# Patient Record
Sex: Male | Born: 1935 | Race: White | Hispanic: No | Marital: Single | Smoking: Former smoker
Health system: Southern US, Community
[De-identification: ages and names within clinical notes are randomized; demographics above are authoritative.]

## PROBLEM LIST (undated history)

## (undated) DIAGNOSIS — Z87438 Personal history of other diseases of male genital organs: Secondary | ICD-10-CM

## (undated) DIAGNOSIS — E785 Hyperlipidemia, unspecified: Secondary | ICD-10-CM

## (undated) DIAGNOSIS — I10 Essential (primary) hypertension: Secondary | ICD-10-CM

## (undated) DIAGNOSIS — R079 Chest pain, unspecified: Secondary | ICD-10-CM

## (undated) DIAGNOSIS — I2510 Atherosclerotic heart disease of native coronary artery without angina pectoris: Secondary | ICD-10-CM

## (undated) DIAGNOSIS — I441 Atrioventricular block, second degree: Secondary | ICD-10-CM

## (undated) DIAGNOSIS — E119 Type 2 diabetes mellitus without complications: Secondary | ICD-10-CM

## (undated) HISTORY — DX: Type 2 diabetes mellitus without complications: E11.9

## (undated) HISTORY — PX: CARDIAC CATHETERIZATION: SHX172

## (undated) HISTORY — DX: Essential (primary) hypertension: I10

## (undated) HISTORY — DX: Atherosclerotic heart disease of native coronary artery without angina pectoris: I25.10

## (undated) HISTORY — DX: Personal history of other diseases of male genital organs: Z87.438

## (undated) HISTORY — DX: Chest pain, unspecified: R07.9

## (undated) HISTORY — DX: Hyperlipidemia, unspecified: E78.5

---

## 2001-03-06 ENCOUNTER — Encounter: Payer: Self-pay | Admitting: Internal Medicine

## 2001-03-06 ENCOUNTER — Observation Stay (HOSPITAL_COMMUNITY): Admission: EM | Admit: 2001-03-06 | Discharge: 2001-03-07 | Payer: Self-pay | Admitting: Emergency Medicine

## 2001-03-07 ENCOUNTER — Encounter: Payer: Self-pay | Admitting: Internal Medicine

## 2009-03-17 ENCOUNTER — Ambulatory Visit: Payer: Self-pay | Admitting: Internal Medicine

## 2009-03-17 ENCOUNTER — Inpatient Hospital Stay (HOSPITAL_COMMUNITY): Admission: EM | Admit: 2009-03-17 | Discharge: 2009-03-28 | Payer: Self-pay | Admitting: Emergency Medicine

## 2009-03-18 ENCOUNTER — Ambulatory Visit: Payer: Self-pay | Admitting: Surgery

## 2009-03-19 ENCOUNTER — Ambulatory Visit: Payer: Self-pay | Admitting: Surgery

## 2009-03-19 ENCOUNTER — Encounter: Payer: Self-pay | Admitting: Surgery

## 2009-03-19 DIAGNOSIS — I2510 Atherosclerotic heart disease of native coronary artery without angina pectoris: Secondary | ICD-10-CM

## 2009-03-19 HISTORY — DX: Atherosclerotic heart disease of native coronary artery without angina pectoris: I25.10

## 2009-04-15 ENCOUNTER — Ambulatory Visit: Payer: Self-pay | Admitting: Surgery

## 2009-04-15 ENCOUNTER — Encounter: Admission: RE | Admit: 2009-04-15 | Discharge: 2009-04-15 | Payer: Self-pay | Admitting: Surgery

## 2009-04-29 ENCOUNTER — Encounter: Admission: RE | Admit: 2009-04-29 | Discharge: 2009-04-29 | Payer: Self-pay | Admitting: Surgery

## 2009-04-29 ENCOUNTER — Ambulatory Visit: Payer: Self-pay | Admitting: Surgery

## 2009-05-21 ENCOUNTER — Encounter: Admission: RE | Admit: 2009-05-21 | Discharge: 2009-05-21 | Payer: Self-pay | Admitting: Surgery

## 2009-05-21 ENCOUNTER — Ambulatory Visit: Payer: Self-pay | Admitting: Surgery

## 2010-04-10 LAB — DIFFERENTIAL
Basophils Relative: 1 % (ref 0–1)
Lymphocytes Relative: 26 % (ref 12–46)
Lymphs Abs: 1.4 10*3/uL (ref 0.7–4.0)
Monocytes Absolute: 0.6 10*3/uL (ref 0.1–1.0)
Monocytes Relative: 11 % (ref 3–12)
Neutro Abs: 3 10*3/uL (ref 1.7–7.7)

## 2010-04-10 LAB — CBC
HCT: 32.7 % — ABNORMAL LOW (ref 39.0–52.0)
Hemoglobin: 11.7 g/dL — ABNORMAL LOW (ref 13.0–17.0)
Hemoglobin: 13.3 g/dL (ref 13.0–17.0)
MCHC: 35.3 g/dL (ref 30.0–36.0)
Platelets: 150 10*3/uL (ref 150–400)
Platelets: 151 10*3/uL (ref 150–400)
RDW: 13.3 % (ref 11.5–15.5)
RDW: 13.4 % (ref 11.5–15.5)
WBC: 7 10*3/uL (ref 4.0–10.5)

## 2010-04-10 LAB — GLUCOSE, CAPILLARY
Glucose-Capillary: 117 mg/dL — ABNORMAL HIGH (ref 70–99)
Glucose-Capillary: 194 mg/dL — ABNORMAL HIGH (ref 70–99)
Glucose-Capillary: 232 mg/dL — ABNORMAL HIGH (ref 70–99)
Glucose-Capillary: 267 mg/dL — ABNORMAL HIGH (ref 70–99)
Glucose-Capillary: 334 mg/dL — ABNORMAL HIGH (ref 70–99)

## 2010-04-10 LAB — HEMOGLOBIN A1C
Hgb A1c MFr Bld: 8.2 % — ABNORMAL HIGH (ref 4.6–6.1)
Mean Plasma Glucose: 189 mg/dL

## 2010-04-10 LAB — COMPREHENSIVE METABOLIC PANEL
AST: 19 U/L (ref 0–37)
Albumin: 3.2 g/dL — ABNORMAL LOW (ref 3.5–5.2)
Alkaline Phosphatase: 34 U/L — ABNORMAL LOW (ref 39–117)
BUN: 31 mg/dL — ABNORMAL HIGH (ref 6–23)
Calcium: 8.5 mg/dL (ref 8.4–10.5)
Chloride: 102 mEq/L (ref 96–112)
GFR calc non Af Amer: 40 mL/min — ABNORMAL LOW (ref >60)
Potassium: 4 mEq/L (ref 3.5–5.1)
Sodium: 138 mEq/L (ref 135–145)
Total Bilirubin: 0.9 mg/dL (ref 0.3–1.2)

## 2010-04-10 LAB — LIPID PANEL
HDL: 35 mg/dL — ABNORMAL LOW (ref >39)
Triglycerides: 181 mg/dL — ABNORMAL HIGH (ref <150)

## 2010-04-10 LAB — POCT I-STAT, CHEM 8
BUN: 34 mg/dL — ABNORMAL HIGH (ref 6–23)
Calcium, Ion: 1.1 mmol/L — ABNORMAL LOW (ref 1.12–1.32)
Creatinine, Ser: 1.8 mg/dL — ABNORMAL HIGH (ref 0.4–1.5)
HCT: 38 % — ABNORMAL LOW (ref 39.0–52.0)
Hemoglobin: 12.9 g/dL — ABNORMAL LOW (ref 13.0–17.0)
TCO2: 26 mmol/L (ref 0–100)

## 2010-04-10 LAB — CARDIAC PANEL(CRET KIN+CKTOT+MB+TROPI)
CK, MB: 3.5 ng/mL (ref 0.3–4.0)
Total CK: 142 U/L (ref 7–232)

## 2010-04-10 LAB — POCT CARDIAC MARKERS
CKMB, poc: 3.8 ng/mL (ref 1.0–8.0)
Myoglobin, poc: 162 ng/mL (ref 12–200)

## 2010-04-10 LAB — TROPONIN I: Troponin I: 0.04 ng/mL (ref 0.00–0.06)

## 2010-04-10 LAB — PROTIME-INR: Prothrombin Time: 13.9 seconds (ref 11.6–15.2)

## 2010-04-10 LAB — CK TOTAL AND CKMB (NOT AT ARMC): Relative Index: 2.9 — ABNORMAL HIGH (ref 0.0–2.5)

## 2010-04-14 LAB — GLUCOSE, CAPILLARY
Glucose-Capillary: 100 mg/dL — ABNORMAL HIGH (ref 70–99)
Glucose-Capillary: 111 mg/dL — ABNORMAL HIGH (ref 70–99)
Glucose-Capillary: 113 mg/dL — ABNORMAL HIGH (ref 70–99)
Glucose-Capillary: 125 mg/dL — ABNORMAL HIGH (ref 70–99)
Glucose-Capillary: 137 mg/dL — ABNORMAL HIGH (ref 70–99)
Glucose-Capillary: 137 mg/dL — ABNORMAL HIGH (ref 70–99)
Glucose-Capillary: 140 mg/dL — ABNORMAL HIGH (ref 70–99)
Glucose-Capillary: 151 mg/dL — ABNORMAL HIGH (ref 70–99)
Glucose-Capillary: 155 mg/dL — ABNORMAL HIGH (ref 70–99)
Glucose-Capillary: 159 mg/dL — ABNORMAL HIGH (ref 70–99)
Glucose-Capillary: 163 mg/dL — ABNORMAL HIGH (ref 70–99)
Glucose-Capillary: 163 mg/dL — ABNORMAL HIGH (ref 70–99)
Glucose-Capillary: 164 mg/dL — ABNORMAL HIGH (ref 70–99)
Glucose-Capillary: 164 mg/dL — ABNORMAL HIGH (ref 70–99)
Glucose-Capillary: 180 mg/dL — ABNORMAL HIGH (ref 70–99)
Glucose-Capillary: 193 mg/dL — ABNORMAL HIGH (ref 70–99)
Glucose-Capillary: 203 mg/dL — ABNORMAL HIGH (ref 70–99)
Glucose-Capillary: 234 mg/dL — ABNORMAL HIGH (ref 70–99)
Glucose-Capillary: 238 mg/dL — ABNORMAL HIGH (ref 70–99)
Glucose-Capillary: 250 mg/dL — ABNORMAL HIGH (ref 70–99)
Glucose-Capillary: 284 mg/dL — ABNORMAL HIGH (ref 70–99)
Glucose-Capillary: 85 mg/dL (ref 70–99)
Glucose-Capillary: 95 mg/dL (ref 70–99)

## 2010-04-14 LAB — BASIC METABOLIC PANEL
BUN: 19 mg/dL (ref 6–23)
BUN: 19 mg/dL (ref 6–23)
BUN: 20 mg/dL (ref 6–23)
BUN: 36 mg/dL — ABNORMAL HIGH (ref 6–23)
BUN: 37 mg/dL — ABNORMAL HIGH (ref 6–23)
BUN: 49 mg/dL — ABNORMAL HIGH (ref 6–23)
BUN: 51 mg/dL — ABNORMAL HIGH (ref 6–23)
BUN: 55 mg/dL — ABNORMAL HIGH (ref 6–23)
CO2: 22 mEq/L (ref 19–32)
CO2: 24 mEq/L (ref 19–32)
CO2: 25 mEq/L (ref 19–32)
CO2: 25 mEq/L (ref 19–32)
Calcium: 7.4 mg/dL — ABNORMAL LOW (ref 8.4–10.5)
Calcium: 7.7 mg/dL — ABNORMAL LOW (ref 8.4–10.5)
Calcium: 7.8 mg/dL — ABNORMAL LOW (ref 8.4–10.5)
Calcium: 7.9 mg/dL — ABNORMAL LOW (ref 8.4–10.5)
Calcium: 8.1 mg/dL — ABNORMAL LOW (ref 8.4–10.5)
Calcium: 8.1 mg/dL — ABNORMAL LOW (ref 8.4–10.5)
Calcium: 8.5 mg/dL (ref 8.4–10.5)
Chloride: 101 mEq/L (ref 96–112)
Chloride: 103 mEq/L (ref 96–112)
Chloride: 105 mEq/L (ref 96–112)
Creatinine, Ser: 1.44 mg/dL (ref 0.4–1.5)
Creatinine, Ser: 1.74 mg/dL — ABNORMAL HIGH (ref 0.4–1.5)
Creatinine, Ser: 1.78 mg/dL — ABNORMAL HIGH (ref 0.4–1.5)
Creatinine, Ser: 1.87 mg/dL — ABNORMAL HIGH (ref 0.4–1.5)
Creatinine, Ser: 1.93 mg/dL — ABNORMAL HIGH (ref 0.4–1.5)
Creatinine, Ser: 1.94 mg/dL — ABNORMAL HIGH (ref 0.4–1.5)
Creatinine, Ser: 1.96 mg/dL — ABNORMAL HIGH (ref 0.4–1.5)
GFR calc Af Amer: 40 mL/min — ABNORMAL LOW (ref >60)
GFR calc Af Amer: 41 mL/min — ABNORMAL LOW (ref >60)
GFR calc Af Amer: 41 mL/min — ABNORMAL LOW (ref >60)
GFR calc Af Amer: 46 mL/min — ABNORMAL LOW (ref >60)
GFR calc Af Amer: 57 mL/min — ABNORMAL LOW (ref >60)
GFR calc non Af Amer: 34 mL/min — ABNORMAL LOW (ref >60)
GFR calc non Af Amer: 34 mL/min — ABNORMAL LOW (ref >60)
GFR calc non Af Amer: 35 mL/min — ABNORMAL LOW (ref >60)
GFR calc non Af Amer: 35 mL/min — ABNORMAL LOW (ref >60)
GFR calc non Af Amer: 37 mL/min — ABNORMAL LOW (ref >60)
GFR calc non Af Amer: 38 mL/min — ABNORMAL LOW (ref >60)
GFR calc non Af Amer: 48 mL/min — ABNORMAL LOW (ref >60)
GFR calc non Af Amer: 52 mL/min — ABNORMAL LOW (ref >60)
Glucose, Bld: 105 mg/dL — ABNORMAL HIGH (ref 70–99)
Glucose, Bld: 110 mg/dL — ABNORMAL HIGH (ref 70–99)
Glucose, Bld: 119 mg/dL — ABNORMAL HIGH (ref 70–99)
Glucose, Bld: 120 mg/dL — ABNORMAL HIGH (ref 70–99)
Glucose, Bld: 176 mg/dL — ABNORMAL HIGH (ref 70–99)
Glucose, Bld: 56 mg/dL — ABNORMAL LOW (ref 70–99)
Glucose, Bld: 79 mg/dL (ref 70–99)
Glucose, Bld: 86 mg/dL (ref 70–99)
Potassium: 3.4 mEq/L — ABNORMAL LOW (ref 3.5–5.1)
Potassium: 4.1 mEq/L (ref 3.5–5.1)
Potassium: 4.1 mEq/L (ref 3.5–5.1)
Potassium: 4.1 mEq/L (ref 3.5–5.1)
Potassium: 4.2 mEq/L (ref 3.5–5.1)
Potassium: 4.4 mEq/L (ref 3.5–5.1)
Potassium: 4.5 mEq/L (ref 3.5–5.1)
Sodium: 131 mEq/L — ABNORMAL LOW (ref 135–145)
Sodium: 134 mEq/L — ABNORMAL LOW (ref 135–145)
Sodium: 135 mEq/L (ref 135–145)
Sodium: 136 mEq/L (ref 135–145)

## 2010-04-14 LAB — PROTIME-INR
INR: 1.15 (ref 0.00–1.49)
Prothrombin Time: 14.6 seconds (ref 11.6–15.2)
Prothrombin Time: 19.6 seconds — ABNORMAL HIGH (ref 11.6–15.2)

## 2010-04-14 LAB — BLOOD GAS, ARTERIAL
Bicarbonate: 23 mEq/L (ref 20.0–24.0)
FIO2: 0.21 %
O2 Saturation: 97.5 %
pCO2 arterial: 38.1 mmHg (ref 35.0–45.0)
pH, Arterial: 7.398 (ref 7.350–7.450)
pO2, Arterial: 85.1 mmHg (ref 80.0–100.0)

## 2010-04-14 LAB — URINALYSIS, ROUTINE W REFLEX MICROSCOPIC
Bilirubin Urine: NEGATIVE
Glucose, UA: 100 mg/dL — AB
Ketones, ur: NEGATIVE mg/dL
Nitrite: NEGATIVE
Specific Gravity, Urine: 1.011 (ref 1.005–1.030)
pH: 7 (ref 5.0–8.0)

## 2010-04-14 LAB — CBC
HCT: 25 % — ABNORMAL LOW (ref 39.0–52.0)
HCT: 26.1 % — ABNORMAL LOW (ref 39.0–52.0)
HCT: 34.8 % — ABNORMAL LOW (ref 39.0–52.0)
HCT: 37 % — ABNORMAL LOW (ref 39.0–52.0)
Hemoglobin: 8.8 g/dL — ABNORMAL LOW (ref 13.0–17.0)
Hemoglobin: 9.7 g/dL — ABNORMAL LOW (ref 13.0–17.0)
MCHC: 34.8 g/dL (ref 30.0–36.0)
MCHC: 35 g/dL (ref 30.0–36.0)
MCHC: 35.1 g/dL (ref 30.0–36.0)
MCHC: 35.2 g/dL (ref 30.0–36.0)
MCV: 94.9 fL (ref 78.0–100.0)
MCV: 97.3 fL (ref 78.0–100.0)
MCV: 97.4 fL (ref 78.0–100.0)
MCV: 98.7 fL (ref 78.0–100.0)
Platelets: 106 10*3/uL — ABNORMAL LOW (ref 150–400)
Platelets: 107 10*3/uL — ABNORMAL LOW (ref 150–400)
Platelets: 118 10*3/uL — ABNORMAL LOW (ref 150–400)
Platelets: 136 10*3/uL — ABNORMAL LOW (ref 150–400)
Platelets: 159 10*3/uL (ref 150–400)
Platelets: 181 10*3/uL (ref 150–400)
Platelets: 232 10*3/uL (ref 150–400)
RBC: 2.51 MIL/uL — ABNORMAL LOW (ref 4.22–5.81)
RBC: 2.53 MIL/uL — ABNORMAL LOW (ref 4.22–5.81)
RBC: 2.64 MIL/uL — ABNORMAL LOW (ref 4.22–5.81)
RDW: 13.7 % (ref 11.5–15.5)
RDW: 13.8 % (ref 11.5–15.5)
RDW: 13.8 % (ref 11.5–15.5)
RDW: 14.1 % (ref 11.5–15.5)
RDW: 14.4 % (ref 11.5–15.5)
RDW: 14.5 % (ref 11.5–15.5)
WBC: 5.3 10*3/uL (ref 4.0–10.5)
WBC: 6.3 10*3/uL (ref 4.0–10.5)
WBC: 6.3 10*3/uL (ref 4.0–10.5)
WBC: 6.4 10*3/uL (ref 4.0–10.5)
WBC: 6.8 10*3/uL (ref 4.0–10.5)
WBC: 7.1 10*3/uL (ref 4.0–10.5)

## 2010-04-14 LAB — PREPARE PLATELETS

## 2010-04-14 LAB — POCT I-STAT 3, ART BLOOD GAS (G3+)
Acid-base deficit: 3 mmol/L — ABNORMAL HIGH (ref 0.0–2.0)
O2 Saturation: 99 %
Patient temperature: 37.5
pCO2 arterial: 28.4 mmHg — ABNORMAL LOW (ref 35.0–45.0)
pCO2 arterial: 35.5 mmHg (ref 35.0–45.0)
pH, Arterial: 7.443 (ref 7.350–7.450)
pO2, Arterial: 130 mmHg — ABNORMAL HIGH (ref 80.0–100.0)
pO2, Arterial: 146 mmHg — ABNORMAL HIGH (ref 80.0–100.0)

## 2010-04-14 LAB — POCT I-STAT 4, (NA,K, GLUC, HGB,HCT)
Glucose, Bld: 156 mg/dL — ABNORMAL HIGH (ref 70–99)
Glucose, Bld: 202 mg/dL — ABNORMAL HIGH (ref 70–99)
HCT: 21 % — ABNORMAL LOW (ref 39.0–52.0)
HCT: 21 % — ABNORMAL LOW (ref 39.0–52.0)
HCT: 26 % — ABNORMAL LOW (ref 39.0–52.0)
HCT: 27 % — ABNORMAL LOW (ref 39.0–52.0)
Hemoglobin: 10.2 g/dL — ABNORMAL LOW (ref 13.0–17.0)
Hemoglobin: 7.1 g/dL — ABNORMAL LOW (ref 13.0–17.0)
Hemoglobin: 9.2 g/dL — ABNORMAL LOW (ref 13.0–17.0)
Potassium: 4.3 mEq/L (ref 3.5–5.1)
Potassium: 5.3 mEq/L — ABNORMAL HIGH (ref 3.5–5.1)
Sodium: 134 mEq/L — ABNORMAL LOW (ref 135–145)

## 2010-04-14 LAB — TYPE AND SCREEN
ABO/RH(D): A POS
Antibody Screen: NEGATIVE

## 2010-04-14 LAB — POCT I-STAT, CHEM 8
Hemoglobin: 8.2 g/dL — ABNORMAL LOW (ref 13.0–17.0)
Sodium: 134 mEq/L — ABNORMAL LOW (ref 135–145)
TCO2: 21 mmol/L (ref 0–100)

## 2010-04-14 LAB — CREATININE, SERUM
Creatinine, Ser: 1.25 mg/dL (ref 0.4–1.5)
Creatinine, Ser: 1.74 mg/dL — ABNORMAL HIGH (ref 0.4–1.5)
GFR calc Af Amer: >60 mL/min (ref >60)
GFR calc non Af Amer: 56 mL/min — ABNORMAL LOW (ref >60)

## 2010-04-14 LAB — PLATELET COUNT: Platelets: 109 10*3/uL — ABNORMAL LOW (ref 150–400)

## 2010-04-14 LAB — MRSA PCR SCREENING: MRSA by PCR: NEGATIVE

## 2010-04-14 LAB — MAGNESIUM
Magnesium: 2.5 mg/dL (ref 1.5–2.5)
Magnesium: 2.8 mg/dL — ABNORMAL HIGH (ref 1.5–2.5)

## 2010-04-14 LAB — ABO/RH: ABO/RH(D): A POS

## 2010-04-14 LAB — HEMOGLOBIN AND HEMATOCRIT, BLOOD: Hemoglobin: 7.1 g/dL — ABNORMAL LOW (ref 13.0–17.0)

## 2010-06-03 NOTE — Assessment & Plan Note (Signed)
OFFICE VISIT   Vernon Jackson, Vernon Jackson  DOB:  December 14, 1927                                        April 29, 2009  CHART #:  [id number]   HISTORY:  The patient comes in today for a 2-week follow up.  He was  last seen on April 15, 2009, with complaints of worsening shortness of  breath and increasing lower extremity edema with weight gain.  On exam,  he had significant lower extremity edema with small left effusion on  chest x-ray.  He was continued on an additional 2-week course of Lasix.  He states that since his last visit his weight and his lower extremity  edema have both improved significantly.  His breathing has improved as  well, although he still gets short of breath with moderate exertion.  He  saw Dr. Eldridge Dace shortly after being seen here in our office and his  amiodarone was discontinued since he has been maintaining sinus rhythm.  No changes to his antihypertensive medications were made.  However, they  did recommend that he double up on his Lasix for the next 3 days, taking  80 mg daily, then resuming a 40 mg daily dose which indicated he would  need to be on indefinitely.  He does have a followup appointment to see  Dr. Eldridge Dace in 2 weeks.  Overall, he feels that he is making some slow  progress.   PHYSICAL EXAMINATION:  Vital Signs:  Blood pressure is 140/68, pulse is  66, respirations 18, O2 sat 96% on room air.  Chest:  Sternal and leg  incisions have healed well.  Sternum is stable to palpation.  Heart:  Regular rate and rhythm without murmurs, rubs, or gallops.  Lungs:  Clear, although breath sounds are slightly diminished in the left base.  Lower Extremities:  Lower extremities remain edematous at around 1+  pitting edema bilaterally, right greater than left.   DIAGNOSTIC TESTS:  Chest x-ray shows a stable to maybe slightly improved  left effusion.   ASSESSMENT AND PLAN:  The patient is making some improvements since his  last visit.  We  will continue him on Lasix as prescribed by Dr.  Eldridge Dace.  I have asked him to return in 2-3  weeks for a follow up with Dr. Laneta Simmers, and we will obtain a repeat chest  x-ray at that time to reevaluate this effusion.   Evelene Croon, M.D.  Electronically Signed   GC/MEDQ  D:  04/29/2009  T:  04/30/2009  Job:  [id number]   cc:   Corky Crafts, MD

## 2010-06-03 NOTE — Assessment & Plan Note (Signed)
OFFICE VISIT   Vernon Jackson, Vernon Jackson  DOB:  September 03, 1927                                        April 15, 2009  CHART #:  [id number]   HISTORY:  The patient comes in today for a postoperative 3-week  followup.  He is status post coronary artery bypass grafting x4 on March 20, 2009.  His postoperative course was complicated by atrial  fibrillation and subsequent bradycardia.  He was treated with amiodarone  and his beta-blocker was discontinued and at the time of discharge, his  heart rate was stable in sinus rhythm.  Also, he had an acute renal  insufficiency postoperatively, which had resolved at the time of  discharge.  Today, he reports worsening symptoms over about the past  week of lower extremity edema and shortness of breath with exertion.  He  had been discharged home on 7 days' worth of Lasix and has completed the  course of this medication.  He notes that his weight has increased 2-3  pounds over this time as well.  He has an appointment to see Dr.  Eldridge Dace this week for Cardiology followup.  He is walking as much as he  is able secondary to previous back and hip problems.  He denies any  chest pain and has not required any pain medication since he was  discharged.  He does have significant insomnia and is not sleeping well  at all.   PHYSICAL EXAMINATION:  Vital Signs:  Blood pressure is 154/69, pulse is  69, respirations 18, and O2 sat 96% on room air.  Chest:  His sternal  and right lower extremity EVH incisions have all healed well.  The  sternum is stable to palpation.  Heart:  Regular rate and rhythm without  murmurs, rubs, or gallops.  Lungs:  Clear, though breath sounds are  slightly diminished in the left base.   Chest x-ray today shows a small left effusion, which is slightly  increased since his discharge chest x-ray as well as some basilar  atelectasis.   ASSESSMENT/PLAN:  Overall, the patient is doing well status post  coronary artery  bypass graft.  In light of his recent weight gain and  worsening lower extremity edema and shortness of breath, I have  prescribed an additional 2-week course of Lasix 40 mg daily and  potassium 20 mEq daily.  We will plan to see him back in 2 weeks for  follow up with a chest x-ray to reevaluate his volume status.  His blood  pressures have started to trend upward, but since he has an appointment  with Dr. Eldridge Dace this week, I have not adjusted his antihypertensive  medications.  He had been started on Norvasc in the hospital as opposed  to an ACE inhibitor because of his acute renal insufficiency.  Also, I  have given him a prescription for 2-week course of Ambien 5 mg nightly  p.r.n. to help with sleep.  He may call in the interim if he experiences  any problems or has questions.   Evelene Croon, M.D.  Electronically Signed   GC/MEDQ  D:  04/15/2009  T:  04/16/2009  Job:  [id number]   cc:   Corky Crafts, MD

## 2010-06-03 NOTE — Assessment & Plan Note (Signed)
OFFICE VISIT   Vernon Jackson, Vernon Jackson  DOB:  01/22/1927                                        May 21, 2009  CHART #:  [id number]   HISTORY:  The patient returns today for followup status post coronary  artery bypass graft surgery x4 on March 20, 2009.  He was seen by one of  my PAs on April 29, 2009.  Since that time, he said he has been making  continued improvement, but still does not feel as good as he would like  to.  He does not walk very much due to pain in his hips.  He has been  riding exercise bicycle twice a day.  He denies any shortness of breath  or chest pain.   PHYSICAL EXAMINATION:  Vital Signs:  Blood pressure is 172/81, pulse 75  and regular, and respiratory rate is 18, unlabored.  Oxygen saturation  on room air is 98%.  General:  He looks well.  Cardiac:  Regular rate  and rhythm with normal heart sounds.  Lungs:  Clear.  His chest incision  is healing well and the sternum is stable.  Extremities:  His leg  incision is healing well.  There is mild bilateral lower extremity edema  to the mid calf level.   DIAGNOSTIC TESTS:  Followup chest x-ray shows minimal residual left  pleural effusion.  Lungs are otherwise clear.   MEDICATIONS:  1. Amlodipine 5 mg daily.  2. Aspirin 325 mg daily.  3. Crestor 10 mg nightly.  4. Iron 150 mg daily.  5. NovoLog 70/30, 20 units b.i.d.  6. Ambien 5 mg p.r.n.   He had been on Lasix 40 mg per day and potassium 20 mEq per day, but  said that Dr. Eldridge Dace told him to stop that recently.   IMPRESSION:  Overall, the patient is making a steady progress following  surgery.  He feels that he has developed some edema in his legs and it  is obvious on exam.  I told him to resume his Lasix and potassium until  he goes back to see Dr. Eldridge Dace.  His blood pressure is also up today,  but it is not sure whether he took his blood pressure medication this  morning.  This also need to be reevaluated when he goes back to  see Dr.  Eldridge Dace.  He had previously been on amlodipine 10 mg per day, but this  was decreased by Dr. Eldridge Dace recently.  I encouraged him to continue  exercising.  He is going to schedule an appointment with an orthopedic  surgeon to have his hips evaluated.  He has a followup appointment with  Dr. Eldridge Dace in a couple of weeks and will return to see me if he  develops any problems with his incisions.   Evelene Croon, M.D.  Electronically Signed   BB/MEDQ  D:  05/21/2009  T:  05/22/2009  Job:  [id number]   cc:   Corky Crafts, MD

## 2012-10-09 ENCOUNTER — Encounter: Payer: Self-pay | Admitting: *Deleted

## 2012-10-09 DIAGNOSIS — I2510 Atherosclerotic heart disease of native coronary artery without angina pectoris: Secondary | ICD-10-CM | POA: Insufficient documentation

## 2012-10-09 DIAGNOSIS — E119 Type 2 diabetes mellitus without complications: Secondary | ICD-10-CM | POA: Insufficient documentation

## 2012-10-09 DIAGNOSIS — Z87438 Personal history of other diseases of male genital organs: Secondary | ICD-10-CM | POA: Insufficient documentation

## 2012-10-09 DIAGNOSIS — E785 Hyperlipidemia, unspecified: Secondary | ICD-10-CM | POA: Insufficient documentation

## 2012-10-09 DIAGNOSIS — I10 Essential (primary) hypertension: Secondary | ICD-10-CM | POA: Insufficient documentation

## 2012-10-20 ENCOUNTER — Ambulatory Visit (INDEPENDENT_AMBULATORY_CARE_PROVIDER_SITE_OTHER): Payer: Medicare HMO | Admitting: Interventional Cardiology

## 2012-10-20 ENCOUNTER — Encounter: Payer: Self-pay | Admitting: Interventional Cardiology

## 2012-10-20 VITALS — BP 144/71 | HR 47 | Ht 66.0 in | Wt 141.6 lb

## 2012-10-20 DIAGNOSIS — R1900 Intra-abdominal and pelvic swelling, mass and lump, unspecified site: Secondary | ICD-10-CM | POA: Insufficient documentation

## 2012-10-20 DIAGNOSIS — I10 Essential (primary) hypertension: Secondary | ICD-10-CM

## 2012-10-20 DIAGNOSIS — E785 Hyperlipidemia, unspecified: Secondary | ICD-10-CM

## 2012-10-20 DIAGNOSIS — I2510 Atherosclerotic heart disease of native coronary artery without angina pectoris: Secondary | ICD-10-CM

## 2012-10-20 MED ORDER — AMLODIPINE BESYLATE 10 MG PO TABS: 10.0000 mg | ORAL_TABLET | Freq: Every day | ORAL | Status: DC

## 2012-10-20 MED ORDER — CLONIDINE HCL 0.1 MG PO TABS: 0.1000 mg | ORAL_TABLET | Freq: Two times a day (BID) | ORAL | Status: DC

## 2012-10-20 NOTE — Patient Instructions (Addendum)
Your physician wants you to follow-up in: 1 year with Dr. Eldridge Dace. You will receive a reminder letter in the mail two months in advance. If you don't receive a letter, please call our office to schedule the follow-up appointment.  Increase Amlodipine to 10mg  daily.  Call in 2 weeks with BP readings. [phone]

## 2012-10-20 NOTE — Progress Notes (Signed)
Patient ID: Vernon Jackson, male   DOB: 11/13/1927, 79 y.o.   MRN: [id number]    [address] [place], [place]  [address] Phone: [phone] Fax:  [phone]  Date:  10/20/2012   ID:  Vernon Jackson, DOB 1927/07/08, MRN [id number]  PCP:  No primary provider on file.      History of Present Illness: Vernon Jackson is a 79 y.o. male who has had CABG. His walking is limited by hip pain. He has not been exercising recently. CAD/ASCVD:  Denies : Chest pain.  Diaphoresis.  Dizziness.  Dyspnea on exertion.  Leg edema.  Nitroglycerin.  Orthopnea.  Palpitations.  Paroxysmal nocturnal dyspnea.  Syncope.     Wt Readings from Last 3 Encounters:  10/20/12 141 lb 9.6 oz (64.229 kg)     Past Medical History  Diagnosis Date  . Coronary artery disease     Bypass graft surgeryx4 03/2009  . CAD (coronary artery disease) 03 01 2011    EF 60%, mild MR   . Hypertension   . Hyperlipidemia   . Diabetes mellitus without complication   . History of BPH   . Chest pain     /1/11 echocardiogram EF 60%, mild MR,    Current Outpatient Prescriptions  Medication Sig Dispense Refill  . amLODipine (NORVASC) 5 MG tablet Take 5 mg by mouth daily.      Marland Kitchen aspirin 325 MG tablet Take 81 mg by mouth daily.       . CloNIDine HCl (CATAPRES PO) Take by mouth as directed.      . insulin aspart protamine- aspart (NOVOLOG MIX 70/30) (70-30) 100 UNIT/ML injection Inject into the skin.      . metoprolol (LOPRESSOR) 50 MG tablet Take 50 mg by mouth as directed.      . simvastatin (ZOCOR) 20 MG tablet Take 20 mg by mouth every evening.      Marland Kitchen spironolactone (ALDACTONE) 50 MG tablet Take 50 mg by mouth as directed.       No current facility-administered medications for this visit.    Allergies:   No Known Allergies  Social History:  The patient  reports that he has never smoked. He does not have any smokeless tobacco history on file.   Family History:  The patient's family history is  not on file.   ROS:  Please see the history of present illness.  No nausea, vomiting.  No fevers, chills.  No focal weakness.  No dysuria.    All other systems reviewed and negative.   PHYSICAL EXAM: VS:  BP 144/71  Pulse 47  Ht 5\' 6"  (1.676 m)  Wt 141 lb 9.6 oz (64.229 kg)  BMI 22.87 kg/m2 Well nourished, well developed, in no acute distress HEENT: normal Neck: no JVD, no carotid bruits Cardiac:  normal S1, S2; RRR;  Lungs:  clear to auscultation bilaterally, no wheezing, rhonchi or rales Abd: soft, nontender, no hepatomegaly Ext: no edema Skin: warm and dry Neuro:   no focal abnormalities noted  EKG:  NSR, PACs, anterolateral T wave inversion - more pronounced since May 31, 2011.  ASSESSMENT AND PLAN:  Coronary atherosclerosis of native coronary artery  Continue Aspirin Tablet, 325 MG, 1 tablet, Orally, Once a day   No angina. COntinue to try to exercise.  2. Hypertension, essential  Increase Amlodipine Besylate Tablet, 10 MG, 1 tablet, Orally, Once a day, 90, Refills 3 Off ACE-I due to renal dysfunction.  Now on  CLonidine. Wean off Clonidine 0.1 BID if increased amlodipine decreases BP.  He will call us to give BP readings.   .  3. Mixed hyperlipidemia  Lipids controlled in 2014; LDL67. Continue simvastatin.  4.  Pulsatile abdominal mass.  I recommend abdominal ultrasound to evaluate for AAA.  He does not want to do the ultrasound.    Signed, Fredric Mare, MD, [facility] 10/20/2012 2:42 PM

## 2012-11-10 ENCOUNTER — Other Ambulatory Visit: Payer: Self-pay | Admitting: Cardiology

## 2012-11-10 MED ORDER — AMLODIPINE BESYLATE 10 MG PO TABS: 10.0000 mg | ORAL_TABLET | Freq: Every day | ORAL | Status: DC

## 2012-12-23 ENCOUNTER — Ambulatory Visit (INDEPENDENT_AMBULATORY_CARE_PROVIDER_SITE_OTHER): Payer: Medicare HMO | Admitting: Podiatrist

## 2012-12-23 ENCOUNTER — Encounter: Payer: Self-pay | Admitting: Podiatrist

## 2012-12-23 DIAGNOSIS — B351 Tinea unguium: Secondary | ICD-10-CM

## 2012-12-23 DIAGNOSIS — E1059 Type 1 diabetes mellitus with other circulatory complications: Secondary | ICD-10-CM

## 2012-12-23 DIAGNOSIS — M79609 Pain in unspecified limb: Secondary | ICD-10-CM

## 2012-12-23 DIAGNOSIS — M216X9 Other acquired deformities of unspecified foot: Secondary | ICD-10-CM

## 2012-12-23 DIAGNOSIS — Q828 Other specified congenital malformations of skin: Secondary | ICD-10-CM

## 2012-12-23 NOTE — Patient Instructions (Signed)
Diabetes and Foot Care Diabetes may cause you to have problems because of poor blood supply (circulation) to your feet and legs. This may cause the skin on your feet to become thinner, break easier, and heal more slowly. Your skin may become dry, and the skin may peel and crack. You may also have nerve damage in your legs and feet causing decreased feeling in them. You may not notice minor injuries to your feet that could lead to infections or more serious problems. Taking care of your feet is one of the most important things you can do for yourself.  HOME CARE INSTRUCTIONS  Wear shoes at all times, even in the house. Do not go barefoot. Bare feet are easily injured.  Check your feet daily for blisters, cuts, and redness. If you cannot see the bottom of your feet, use a mirror or ask someone for help.  Wash your feet with warm water (do not use hot water) and mild soap. Then pat your feet and the areas between your toes until they are completely dry. Do not soak your feet as this can dry your skin.  Apply a moisturizing lotion or petroleum jelly (that does not contain alcohol and is unscented) to the skin on your feet and to dry, brittle toenails. Do not apply lotion between your toes.  Trim your toenails straight across. Do not dig under them or around the cuticle. File the edges of your nails with an emery board or nail file.  Do not cut corns or calluses or try to remove them with medicine.  Wear clean socks or stockings every day. Make sure they are not too tight. Do not wear knee-high stockings since they may decrease blood flow to your legs.  Wear shoes that fit properly and have enough cushioning. To break in new shoes, wear them for just a few hours a day. This prevents you from injuring your feet. Always look in your shoes before you put them on to be sure there are no objects inside.  Do not cross your legs. This may decrease the blood flow to your feet.  If you find a minor scrape,  cut, or break in the skin on your feet, keep it and the skin around it clean and dry. These areas may be cleansed with mild soap and water. Do not cleanse the area with peroxide, alcohol, or iodine.  When you remove an adhesive bandage, be sure not to damage the skin around it.  If you have a wound, look at it several times a day to make sure it is healing.  Do not use heating pads or hot water bottles. They may burn your skin. If you have lost feeling in your feet or legs, you may not know it is happening until it is too late.  Make sure your health care provider performs a complete foot exam at least annually or more often if you have foot problems. Report any cuts, sores, or bruises to your health care provider immediately. SEEK MEDICAL CARE IF:   You have an injury that is not healing.  You have cuts or breaks in the skin.  You have an ingrown nail.  You notice redness on your legs or feet.  You feel burning or tingling in your legs or feet.  You have pain or cramps in your legs and feet.  Your legs or feet are numb.  Your feet always feel cold. SEEK IMMEDIATE MEDICAL CARE IF:   There is increasing redness,   swelling, or pain in or around a wound.  There is a red line that goes up your leg.  Pus is coming from a wound.  You develop a fever or as directed by your health care provider.  You notice a bad smell coming from an ulcer or wound. Document Released: 01/03/2000 Document Revised: 09/07/2012 Document Reviewed: 06/14/2012 [employer] Patient Information 2014 [employer].  

## 2012-12-23 NOTE — Progress Notes (Signed)
  Chief Complaint  Patient presents with  . Nail Problem    Debridement Toenails and Calluses B/L;  "Trim my toenails and I have a callus below the little toe on each foot."     HPI: Patient is 79 y.o. male who presents today for  Foot and nail care.  Patient is diabetic and relates no problems with sensation.  Does relate lower extremity swelling at times.  Patient is complainng of severely long toenails and calluses  bilaterally.      Physical Exam  GENERAL APPEARANCE: Alert, conversant. Appropriately groomed. No acute distress.  VASCULAR: Pedal pulses palpable at 2/4 dp and 1/4 pt bilateral.  Capillary refill time is increased to digits,  Digital hair growth is decreased bilateral NEUROLOGIC: sensation is intact epicritically and protectively to 5.07 monofilament at 5/5 sites bilateral.  Light touch is intact bilateral, vibratory sensation intact bilateral, MUSCULOSKELETAL: acceptable muscle strength, tone and stability bilateral.  Intrinsic muscluature intact bilateral.   DERMATOLOGIC: skin color, texture, and turger are within normal limits.  Significantly elongated, thickened, dystrophic toenails 1-5 bilateral are seen.  Hyperkeratotic lesions submetatarsal 5 bilateral noted. Xerotic skin noted   Assessment: diabetes with angiopathy; plantarflexed 5th metatarsals, porokeratotic lesions x 2; symptomatic onychomycosis x 10  Plan: Discussed etiology, pathology, conservative vs. Surgical therapies and at this time debridement of hyperkeratotic lesions and symptomatic toenails was recommended.  Debridment accomplished with a number 15 blade and onychoreduction of symptomatic toenails was performed without iatrogenic incident.  Patient was instructed on signs and symptoms of infection and was told to call immediately should any of these arise.    Marlowe Aschoff, DPM  [facility]

## 2013-03-16 ENCOUNTER — Other Ambulatory Visit: Payer: Self-pay | Admitting: Interventional Cardiology

## 2013-03-17 ENCOUNTER — Telehealth: Payer: Self-pay | Admitting: Cardiology

## 2013-03-17 NOTE — Telephone Encounter (Signed)
Refilled lasix, which is not on pts med list. I looked in ecw and it stated pt uses lasix infrequently. FYI to Dr. Eldridge DaceVaranasi.

## 2013-03-17 NOTE — Telephone Encounter (Signed)
Is patient to be taking this? Thanks, MI

## 2013-03-17 NOTE — Telephone Encounter (Signed)
Refilled lasix, which was not on med list. It was on med list in ecw and it takes pt uses infrequently. FYI to Dr. Eldridge DaceVaranasi.

## 2013-03-23 ENCOUNTER — Ambulatory Visit: Payer: Medicare HMO | Admitting: Podiatrist

## 2013-04-09 ENCOUNTER — Encounter (HOSPITAL_COMMUNITY): Payer: Self-pay | Admitting: Emergency Medicine

## 2013-04-09 ENCOUNTER — Observation Stay (HOSPITAL_COMMUNITY)
Admission: EM | Admit: 2013-04-09 | Discharge: 2013-04-10 | Disposition: A | Discharge status: Home / Self Care | Payer: Medicare HMO | Attending: Internal Medicine | Admitting: Internal Medicine

## 2013-04-09 ENCOUNTER — Emergency Department (HOSPITAL_COMMUNITY): Payer: Medicare HMO

## 2013-04-09 DIAGNOSIS — Z87891 Personal history of nicotine dependence: Secondary | ICD-10-CM | POA: Insufficient documentation

## 2013-04-09 DIAGNOSIS — E785 Hyperlipidemia, unspecified: Secondary | ICD-10-CM | POA: Insufficient documentation

## 2013-04-09 DIAGNOSIS — I639 Cerebral infarction, unspecified: Secondary | ICD-10-CM | POA: Diagnosis present

## 2013-04-09 DIAGNOSIS — Z794 Long term (current) use of insulin: Secondary | ICD-10-CM | POA: Insufficient documentation

## 2013-04-09 DIAGNOSIS — I441 Atrioventricular block, second degree: Secondary | ICD-10-CM

## 2013-04-09 DIAGNOSIS — I4891 Unspecified atrial fibrillation: Secondary | ICD-10-CM

## 2013-04-09 DIAGNOSIS — Z79899 Other long term (current) drug therapy: Secondary | ICD-10-CM | POA: Insufficient documentation

## 2013-04-09 DIAGNOSIS — I2510 Atherosclerotic heart disease of native coronary artery without angina pectoris: Secondary | ICD-10-CM | POA: Insufficient documentation

## 2013-04-09 DIAGNOSIS — R29810 Facial weakness: Secondary | ICD-10-CM | POA: Insufficient documentation

## 2013-04-09 DIAGNOSIS — E119 Type 2 diabetes mellitus without complications: Secondary | ICD-10-CM

## 2013-04-09 DIAGNOSIS — I10 Essential (primary) hypertension: Secondary | ICD-10-CM

## 2013-04-09 DIAGNOSIS — G459 Transient cerebral ischemic attack, unspecified: Secondary | ICD-10-CM

## 2013-04-09 DIAGNOSIS — I6350 Cerebral infarction due to unspecified occlusion or stenosis of unspecified cerebral artery: Secondary | ICD-10-CM

## 2013-04-09 DIAGNOSIS — Z7982 Long term (current) use of aspirin: Secondary | ICD-10-CM | POA: Insufficient documentation

## 2013-04-09 DIAGNOSIS — N400 Enlarged prostate without lower urinary tract symptoms: Secondary | ICD-10-CM | POA: Insufficient documentation

## 2013-04-09 DIAGNOSIS — Z9889 Other specified postprocedural states: Secondary | ICD-10-CM | POA: Insufficient documentation

## 2013-04-09 LAB — DIFFERENTIAL
Basophils Absolute: 0 10*3/uL (ref 0.0–0.1)
Basophils Relative: 0 % (ref 0–1)
EOS ABS: 0.4 10*3/uL (ref 0.0–0.7)
Eosinophils Relative: 5 % (ref 0–5)
Lymphocytes Relative: 34 % (ref 12–46)
Lymphs Abs: 2.6 10*3/uL (ref 0.7–4.0)
MONOS PCT: 12 % (ref 3–12)
Monocytes Absolute: 0.9 10*3/uL (ref 0.1–1.0)
NEUTROS PCT: 49 % (ref 43–77)
Neutro Abs: 3.7 10*3/uL (ref 1.7–7.7)

## 2013-04-09 LAB — CBC
HCT: 37.2 % — ABNORMAL LOW (ref 39.0–52.0)
HEMOGLOBIN: 13.3 g/dL (ref 13.0–17.0)
MCH: 33.1 pg (ref 26.0–34.0)
MCHC: 35.8 g/dL (ref 30.0–36.0)
MCV: 92.5 fL (ref 78.0–100.0)
Platelets: 121 10*3/uL — ABNORMAL LOW (ref 150–400)
RBC: 4.02 MIL/uL — AB (ref 4.22–5.81)
RDW: 13.7 % (ref 11.5–15.5)
WBC: 7.6 10*3/uL (ref 4.0–10.5)

## 2013-04-09 LAB — PROTIME-INR
INR: 1.13 (ref 0.00–1.49)
Prothrombin Time: 14.3 seconds (ref 11.6–15.2)

## 2013-04-09 LAB — COMPREHENSIVE METABOLIC PANEL
ALBUMIN: 4.2 g/dL (ref 3.5–5.2)
ALK PHOS: 75 U/L (ref 39–117)
ALT: 25 U/L (ref 0–53)
AST: 33 U/L (ref 0–37)
BUN: 37 mg/dL — AB (ref 6–23)
CO2: 22 mEq/L (ref 19–32)
CREATININE: 1.79 mg/dL — AB (ref 0.50–1.35)
Calcium: 9.4 mg/dL (ref 8.4–10.5)
Chloride: 97 mEq/L (ref 96–112)
GFR calc non Af Amer: 33 mL/min — ABNORMAL LOW (ref >90)
GFR, EST AFRICAN AMERICAN: 39 mL/min — AB (ref >90)
GLUCOSE: 189 mg/dL — AB (ref 70–99)
Potassium: 4.5 mEq/L (ref 3.7–5.3)
Sodium: 136 mEq/L — ABNORMAL LOW (ref 137–147)
TOTAL PROTEIN: 8.1 g/dL (ref 6.0–8.3)
Total Bilirubin: 0.9 mg/dL (ref 0.3–1.2)

## 2013-04-09 LAB — CBG MONITORING, ED: Glucose-Capillary: 193 mg/dL — ABNORMAL HIGH (ref 70–99)

## 2013-04-09 LAB — GLUCOSE, CAPILLARY
GLUCOSE-CAPILLARY: 179 mg/dL — AB (ref 70–99)
GLUCOSE-CAPILLARY: 173 mg/dL — AB (ref 70–99)

## 2013-04-09 LAB — I-STAT TROPONIN, ED: TROPONIN I, POC: 0 ng/mL (ref 0.00–0.08)

## 2013-04-09 LAB — APTT: aPTT: 40 seconds — ABNORMAL HIGH (ref 24–37)

## 2013-04-09 MED ORDER — ASPIRIN 81 MG PO CHEW
81.0000 mg | CHEWABLE_TABLET | Freq: Once | ORAL | Status: AC
  Administered 2013-04-09: 81 mg via ORAL
  Filled 2013-04-09: qty 1

## 2013-04-09 MED ORDER — ZOLPIDEM TARTRATE 5 MG PO TABS: 2.5000 mg | ORAL_TABLET | Freq: Every evening | ORAL | Status: DC | PRN

## 2013-04-09 MED ORDER — HEPARIN (PORCINE) IN NACL 100-0.45 UNIT/ML-% IJ SOLN
900.0000 [IU]/h | INTRAMUSCULAR | Status: DC
  Administered 2013-04-09: 800 [IU]/h via INTRAVENOUS
  Filled 2013-04-09 (×2): qty 250

## 2013-04-09 MED ORDER — SENNOSIDES-DOCUSATE SODIUM 8.6-50 MG PO TABS
1.0000 | ORAL_TABLET | Freq: Every evening | ORAL | Status: DC | PRN
  Filled 2013-04-09: qty 1

## 2013-04-09 MED ORDER — FUROSEMIDE 40 MG PO TABS
40.0000 mg | ORAL_TABLET | Freq: Every day | ORAL | Status: DC
  Administered 2013-04-09: 40 mg via ORAL
  Filled 2013-04-09 (×2): qty 1

## 2013-04-09 MED ORDER — ASPIRIN EC 81 MG PO TBEC
81.0000 mg | DELAYED_RELEASE_TABLET | Freq: Every day | ORAL | Status: DC
  Administered 2013-04-10: 81 mg via ORAL
  Filled 2013-04-09: qty 1

## 2013-04-09 MED ORDER — ENOXAPARIN SODIUM 40 MG/0.4ML ~~LOC~~ SOLN
40.0000 mg | SUBCUTANEOUS | Status: DC
  Filled 2013-04-09: qty 0.4

## 2013-04-09 MED ORDER — HYDRALAZINE HCL 20 MG/ML IJ SOLN: 10.0000 mg | Freq: Three times a day (TID) | INTRAMUSCULAR | Status: DC | PRN

## 2013-04-09 MED ORDER — INSULIN ASPART 100 UNIT/ML ~~LOC~~ SOLN
0.0000 [IU] | Freq: Three times a day (TID) | SUBCUTANEOUS | Status: DC
  Administered 2013-04-09 – 2013-04-10 (×2): 2 [IU] via SUBCUTANEOUS

## 2013-04-09 MED ORDER — METOPROLOL TARTRATE 50 MG PO TABS
50.0000 mg | ORAL_TABLET | Freq: Every day | ORAL | Status: DC
  Administered 2013-04-09: 50 mg via ORAL
  Filled 2013-04-09 (×2): qty 1

## 2013-04-09 MED ORDER — SIMVASTATIN 20 MG PO TABS
20.0000 mg | ORAL_TABLET | Freq: Every evening | ORAL | Status: DC
  Administered 2013-04-09: 20 mg via ORAL
  Filled 2013-04-09 (×2): qty 1

## 2013-04-09 NOTE — H&P (Signed)
[facility] History and Physical  Vernon MackintoshRichard H L Sherbert ZOX:[id number] DOB: 02/02/1927 DOA: 04/09/2013  Referring physician: Dr Patria Maneampos.  PCP: Quentin MullingHOOPER,JEFFREY C, MD   Chief Complaint: slurred speech.   HPI: Vernon MackintoshRichard H L Luevanos is a 80 y.o. male with PMH significant for Hypertension, CAD, Diabetes, CAD who presents after an episode of slurred speech and left facial weakness. He relates that symptoms happen around 10 Am. He was at church when happen. Church member notice change on his speech and drooling. Patient symptoms resolved before 12 noon. He is back to baseline now. He has notice changes on his speech for last few month, slow speech.  He denies palpitation, chest pain or dyspnea. He has notice increase swelling of lower extremities for last few weeks. He just got refill for lasix.    Review of Systems:  Negative, except as per HPI.   Past Medical History  Diagnosis Date  . Coronary artery disease     Bypass graft surgeryx4 03/2009  . CAD (coronary artery disease) 03 01 2011    EF 60%, mild MR   . Hypertension   . Hyperlipidemia   . Diabetes mellitus without complication   . History of BPH   . Chest pain     /1/11 echocardiogram EF 60%, mild MR,   Past Surgical History  Procedure Laterality Date  . Cardiac catheterization      CABG X 4 vessel complicated by Atrial fib and bradycardia   Social History:  reports that he has quit smoking. He does not have any smokeless tobacco history on file. He reports that he does not drink alcohol or use illicit drugs.  No Known Allergies  Family History  Problem Relation Age of Onset  . Diabetes Father   . Diabetes Brother   . Diabetes Brother   . Diabetes Son      Prior to Admission medications   Medication Sig Start Date End Date Taking? Authorizing Provider  amLODipine (NORVASC) 10 MG tablet Take 1 tablet (10 mg total) by mouth daily. 11/10/12  Yes Everette RankJay Varanasi, MD  aspirin EC 81 MG tablet Take 81 mg by mouth daily.   Yes  Historical Provider, MD  cloNIDine (CATAPRES) 0.1 MG tablet Take 1 tablet (0.1 mg total) by mouth 2 (two) times daily. 10/20/12  Yes Everette RankJay Varanasi, MD  furosemide (LASIX) 40 MG tablet take 1 tablet by mouth ONLY AS NEEDED   Yes Everette RankJay Varanasi, MD  insulin aspart protamine- aspart (NOVOLOG MIX 70/30) (70-30) 100 UNIT/ML injection Inject 20-30 Units into the skin 2 (two) times daily with a meal.    Yes Historical Provider, MD  metoprolol (LOPRESSOR) 50 MG tablet Take 50 mg by mouth daily.    Yes Historical Provider, MD  simvastatin (ZOCOR) 20 MG tablet Take 20 mg by mouth every evening.   Yes Historical Provider, MD  spironolactone (ALDACTONE) 50 MG tablet Take 50 mg by mouth daily.    Yes Historical Provider, MD  zolpidem (AMBIEN) 5 MG tablet Take 2.5-5 mg by mouth at bedtime as needed for sleep.   Yes Historical Provider, MD   Physical Exam: Filed Vitals:   04/09/13 1555  BP:   Pulse:   Temp: 97.8 F (36.6 C)  Resp:     BP 117/103  Pulse 71  Temp(Src) 97.8 F (36.6 C) (Oral)  Resp 13  Ht 5\' 5"  (1.651 m)  Wt 68.04 kg (150 lb)  BMI 24.96 kg/m2  SpO2 98%  General:  Appears calm and comfortable  Eyes: PERRL, normal lids, irises & conjunctiva ENT: grossly normal hearing, lips & tongue Neck: no LAD, masses or thyromegaly Cardiovascular: RRR, no m/r/g. Bilateral  LE edema. Telemetry: SR, no arrhythmias  Respiratory: CTA bilaterally, no w/r/r. Normal respiratory effort. Abdomen: soft, ntnd Skin: no rash or induration seen on limited exam Musculoskeletal: grossly normal tone BUE/BLE Neurologic: grossly non-focal. Slow speech, at baseline per daughter.           Labs on Admission:  Basic Metabolic Panel:  Recent Labs Lab 04/09/13 1344  NA 136*  K 4.5  CL 97  CO2 22  GLUCOSE 189*  BUN 37*  CREATININE 1.79*  CALCIUM 9.4   Liver Function Tests:  Recent Labs Lab 04/09/13 1344  AST 33  ALT 25  ALKPHOS 75  BILITOT 0.9  PROT 8.1  ALBUMIN 4.2   No results found for this  basename: LIPASE, AMYLASE,  in the last 168 hours No results found for this basename: AMMONIA,  in the last 168 hours CBC:  Recent Labs Lab 04/09/13 1344  WBC 7.6  NEUTROABS 3.7  HGB 13.3  HCT 37.2*  MCV 92.5  PLT 121*   Cardiac Enzymes: No results found for this basename: CKTOTAL, CKMB, CKMBINDEX, TROPONINI,  in the last 168 hours  BNP (last 3 results) No results found for this basename: PROBNP,  in the last 8760 hours CBG:  Recent Labs Lab 04/09/13 1341  GLUCAP 193*    Radiological Exams on Admission: Ct Head Wo Contrast  04/09/2013   CLINICAL DATA:  80 year old male with slurred speech and facial droop.  EXAM: CT HEAD WITHOUT CONTRAST  TECHNIQUE: Contiguous axial images were obtained from the base of the skull through the vertex without intravenous contrast.  COMPARISON:  None  FINDINGS: An age indeterminate infarct within the posterior left subinsular region/posterior internal capsule identified.  Atrophy and chronic small-vessel white matter ischemic changes are noted.  There is no evidence of hemorrhage, mass lesion or mass effect, hydrocephalus or extra-axial collection.  No acute bony abnormalities are identified.  IMPRESSION: Age indeterminate infarct within the posterior left subinsular regions/posterior internal capsule - likely subacute to chronic. No evidence of hemorrhage.  Atrophy and chronic small-vessel white matter ischemic changes.   Electronically Signed   By: Laveda Abbe M.D.   On: 04/09/2013 15:29    EKG: Independently reviewed. A fib.   Assessment/Plan Active Problems:   CAD (coronary artery disease)   Hypertension   Diabetes mellitus without complication   Atrial fibrillation   TIA (transient ischemic attack)   Stroke  1-TIA vs Stroke; Admit patient to telemetry, neuro floor. EKG showed A fib, rate controlled. Discussed case with Dr Amada Jupiter will start Heparin stroke protocol. Will proceed with MRI, MRA, Carotid doppler, ECHO. Will hold clonidine,  Norvasc to allow permissive HTN in setting of possible acute stroke. If MRI negative could transition heparin to Lovenox.   2-A fib; EKG show A fib. Monitor on telemetry. Heparin per pharmacy protocol. Continue with Metoprolol.   3-CKD stage III: Cr per record 1.7 to 1.9. Continue with lasix. Monitor daily.   4-Diabetes; hold 70/30. Start SSI.   5-Hypertension; permissive HTN. Hold spironolactone, Norvasc, clonidine. PRN hydralazine for SBP more than 190.   6-Thrombocytopenia; monitor.   7-LE edema; worse right. Check doppler. Continue with lasix.   Code Status: Patient wishes to be DNR.  Family Communication: care discussed with family member at bedside.  Disposition Plan: expect 2 to 3 days inpatient  Time spent: 75 minutes.  Hartley Barefoot A [facility] Pager [phone]

## 2013-04-09 NOTE — Consult Note (Signed)
Neurology Consultation Reason for Consult: left facial droop Referring Physician: Patria Mane, k  CC: left facial weakness  History is obtained from:patient  HPI: Vernon Jackson is a 80 y.o. male with a history of htn, cad, dm who was in his normal state of health earlier. He was making an Orthoptist at church and during the announcement a friend noticed that his speech seemed quite slurred, and he had white from his mouth several times. She then noticed that his left face seemed quite drooped. He continued to have slurred speech, but she states that after church his symptoms seem to have resolved.   LKW: 10:30 AM tpa given?: no, resolved symptoms    ROS: A 14 point ROS was performed and is negative except as noted in the HPI.  Past Medical History  Diagnosis Date  . Coronary artery disease     Bypass graft surgeryx4 03/2009  . CAD (coronary artery disease) 03 01 2011    EF 60%, mild MR   . Hypertension   . Hyperlipidemia   . Diabetes mellitus without complication   . History of BPH   . Chest pain     /1/11 echocardiogram EF 60%, mild MR,    Family History: dm  Social History: Tob: former smoker  Exam: Current vital signs: BP 153/53  Pulse 122  Temp(Src) 98 F (36.7 C) (Oral)  Resp 18  Ht 5\' 5"  (1.651 m)  Wt 68.04 kg (150 lb)  BMI 24.96 kg/m2  SpO2 96% Vital signs in last 24 hours: Temp:  [98 F (36.7 C)] 98 F (36.7 C) (03/22 1337) Pulse Rate:  [122] 122 (03/22 1337) Resp:  [18] 18 (03/22 1337) BP: (153)/(53) 153/53 mmHg (03/22 1337) SpO2:  [96 %] 96 % (03/22 1337) Weight:  [68.04 kg (150 lb)] 68.04 kg (150 lb) (03/22 1337)  General: in bed, NAD CV: RRR Mental Status: Patient is awake, alert, oriented to person, place, month, year, and situation. Immediate and remote memory are intact. Patient is able to give a clear and coherent history. No signs of aphasia or neglect Cranial Nerves: II: Visual Fields are full. Pupils are equal, round, and reactive  to light.  Discs are difficult to visualize. III,IV, VI: EOMI without ptosis or diploplia.  V: Facial sensation is symmetric to temperature VII: Facial movement is symmetric.  VIII: hearing is intact to voice X: Uvula elevates symmetrically XI: Shoulder shrug is symmetric. XII: tongue is midline without atrophy or fasciculations.  Motor: Tone is normal. Bulk is normal. 5/5 strength was present in all four extremities.  Sensory: Sensation is symmetric to light touch and temperature in the arms and legs. Deep Tendon Reflexes: 2+ and symmetric in the biceps and patellae.  Plantars: Toes are downgoing bilaterally.  Cerebellar: FNF  coarse postural and intentional tremor present bilaterally, not clearly ataxia, but difficult to assess due to tremor. .  Gait: Not assessed due to acute nature of evaluation and multiple medical monitors in ED setting.     I have reviewed labs in epic and the results pertinent to this consultation are: cbg 193  I have reviewed the images obtained: CT head- pending  Impression: 80 yo M with transient left facial droop and slurred speech that has since resolved concerning for TIA. He has multiple risk factors for stroke and I would favor treating this as TIA.   Recommendations: 1. HgbA1c, fasting lipid panel 2. MRI, MRA  of the brain without contrast 3. Frequent neuro checks 4. Echocardiogram 5.  Carotid dopplers 6. Prophylactic therapy-Antiplatelet med: Aspirin - dose 325mg  7. Risk factor modification 8. Telemetry monitoring 9. PT consult, OT consult, Speech consult    Ritta SlotMcNeill Donya Hitch, MD [facility] [phone]  If 7pm- 7am, please page neurology on call as listed in AMION.

## 2013-04-09 NOTE — Progress Notes (Signed)
ANTICOAGULATION CONSULT NOTE - Initial Consult  Pharmacy Consult for Heparin Indication:  Risk factors associated with Stroke  No Known Allergies  Patient Measurements: Height: 5\' 5"  (165.1 cm) Weight: 150 lb (68.04 kg) IBW/kg (Calculated) : 61.5  Vital Signs: Temp: 97.8 F (36.6 C) (03/22 1555) Temp src: Oral (03/22 1337) BP: 113/51 mmHg (03/22 1630) Pulse Rate: 116 (03/22 1630)  Labs:  Recent Labs  04/09/13 1344  HGB 13.3  HCT 37.2*  PLT 121*  APTT 40*  LABPROT 14.3  INR 1.13  CREATININE 1.79*   Estimated Creatinine Clearance: 27.2 ml/min (by C-G formula based on Cr of 1.79).  Medical History: Past Medical History  Diagnosis Date  . Coronary artery disease     Bypass graft surgeryx4 03/2009  . CAD (coronary artery disease) 03 01 2011    EF 60%, mild MR   . Hypertension   . Hyperlipidemia   . Diabetes mellitus without complication   . History of BPH   . Chest pain     /1/11 echocardiogram EF 60%, mild MR,   Assessment: 80yo male admitted with stroke like symptoms.  He has had a CT which is negative for hemorrhage.  We have been asked to initiate IV Heparin for him while his work-up is ongoing.  His CBC is stable and no noted bleeding.  He does have some mild thrombocytopenia with platelet count of 121K.  His PT/INR is 14.3/1.13 and within normal range.  Goal of Therapy:  Heparin level 0.3-0.5 units/ml Monitor platelets by anticoagulation protocol: Yes   Plan:  1.  Begin IV heparin infusion without a bolus at 800 units/hr 2.  Obtain a heparin level 8 hours after start 3.  Daily heparin level and CBC  Nadara MustardNita Manasi Dishon, PharmD., MS Clinical Pharmacist Pager:  [phone] Thank you for allowing pharmacy to be part of this patients care team. 04/09/2013,4:40 PM

## 2013-04-09 NOTE — Progress Notes (Signed)
Page by Kathline MagicN Deborah patient's BP 150/47 but heart rate 30s-40s, currently asymptomatic. Appears patient receive metoprolol 50 mg at 1830 after already taking home dose.    A/P  Asymptomatic bradycardia -Continue to monitor

## 2013-04-09 NOTE — Progress Notes (Signed)
HR 39-41, BP 150/47.  Patient sleeping, asymptomatic.  Received metoprolol 50 mg po at 1833.  Per PTA med list, patient had already taken metoprolol at home this am.  Dr. Joseph ArtWoods notified and order received to continue to monitor patient's HR and BP.  Will continue to monitor.  Alonza Bogusuvall, Ottavio Norem Gray

## 2013-04-09 NOTE — ED Notes (Signed)
Pt presents to department for evaluation of slurred speech, facial droop and drooling this morning. LSN: 10am this morning at church. States difficulty swallowing and drinking x1 month. Denies pain. Pt is alert and oriented x4. Slurred speech noted in triage. Able to move all extremities.

## 2013-04-09 NOTE — ED Provider Notes (Signed)
CSN: [id number]     Arrival date & time 04/09/13  1322 History   First MD Initiated Contact with Patient 04/09/13 1330     Chief Complaint  Patient presents with  . Code Stroke      HPI Patient is brought to the emergency department for complaints of slurred speech and left-sided facial droop as well as possible drooling out the left side of his face today.  The symptoms are noticed at 10:30 AM.  The patient does have a history of prior stroke several years ago.  The patient awoke this morning he felt fine.  He denies weakness in his arms and legs.  Family reports that his face has returned back to normal at this time.  Patient has no complaints.  Family states that he seems to be acting normally at this time.  No recent illness.  No fevers or chills.  No headache.  No chest pain shortness of breath.  No other complaints.patient does have a history of coronary artery disease, hypertension, hyperlipidemia, diabetes.used to smoke cigarettes but no longer does.  He is on a daily aspirin.   Past Medical History  Diagnosis Date  . Coronary artery disease     Bypass graft surgeryx4 03/2009  . CAD (coronary artery disease) 03 01 2011    EF 60%, mild MR   . Hypertension   . Hyperlipidemia   . Diabetes mellitus without complication   . History of BPH   . Chest pain     /1/11 echocardiogram EF 60%, mild MR,   Past Surgical History  Procedure Laterality Date  . Cardiac catheterization      CABG X 4 vessel complicated by Atrial fib and bradycardia   Family History  Problem Relation Age of Onset  . Diabetes Father   . Diabetes Brother   . Diabetes Brother   . Diabetes Son    History  Substance Use Topics  . Smoking status: Former Games developermoker  . Smokeless tobacco: Not on file  . Alcohol Use: No    Review of Systems  All other systems reviewed and are negative.      Allergies  Review of patient's allergies indicates no known allergies.  Home Medications   Current Outpatient Rx   Name  Route  Sig  Dispense  Refill  . amLODipine (NORVASC) 10 MG tablet   Oral   Take 1 tablet (10 mg total) by mouth daily.   30 tablet   11   . aspirin EC 81 MG tablet   Oral   Take 81 mg by mouth daily.         . cloNIDine (CATAPRES) 0.1 MG tablet   Oral   Take 1 tablet (0.1 mg total) by mouth 2 (two) times daily.         . furosemide (LASIX) 40 MG tablet      take 1 tablet by mouth ONLY AS NEEDED   30 tablet   6   . insulin aspart protamine- aspart (NOVOLOG MIX 70/30) (70-30) 100 UNIT/ML injection   Subcutaneous   Inject 20-30 Units into the skin 2 (two) times daily with a meal.          . metoprolol (LOPRESSOR) 50 MG tablet   Oral   Take 50 mg by mouth daily.          . simvastatin (ZOCOR) 20 MG tablet   Oral   Take 20 mg by mouth every evening.         .Marland Kitchen  spironolactone (ALDACTONE) 50 MG tablet   Oral   Take 50 mg by mouth daily.          Marland Kitchen zolpidem (AMBIEN) 5 MG tablet   Oral   Take 2.5-5 mg by mouth at bedtime as needed for sleep.          BP 137/51  Pulse 119  Temp(Src) 98 F (36.7 C) (Oral)  Resp 15  Ht 5\' 5"  (1.651 m)  Wt 150 lb (68.04 kg)  BMI 24.96 kg/m2  SpO2 96% Physical Exam  Nursing note and vitals reviewed. Constitutional: He is oriented to person, place, and time. He appears well-developed and well-nourished.  HENT:  Head: Normocephalic and atraumatic.  Eyes: EOM are normal. Pupils are equal, round, and reactive to light.  Neck: Normal range of motion.  Cardiovascular: Normal rate, regular rhythm, normal heart sounds and intact distal pulses.   Pulmonary/Chest: Effort normal and breath sounds normal. No respiratory distress.  Abdominal: Soft. He exhibits no distension. There is no tenderness.  Musculoskeletal: Normal range of motion.  Neurological: He is alert and oriented to person, place, and time.  5/5 strength in major muscle groups of  bilateral upper and lower extremities. Speech normal. No facial asymetry.    Skin: Skin is warm and dry.  Psychiatric: He has a normal mood and affect. Judgment normal.    ED Course  Procedures (including critical care time) Labs Review Labs Reviewed  APTT - Abnormal; Notable for the following:    aPTT 40 (*)    All other components within normal limits  CBC - Abnormal; Notable for the following:    RBC 4.02 (*)    HCT 37.2 (*)    Platelets 121 (*)    All other components within normal limits  COMPREHENSIVE METABOLIC PANEL - Abnormal; Notable for the following:    Sodium 136 (*)    Glucose, Bld 189 (*)    BUN 37 (*)    Creatinine, Ser 1.79 (*)    GFR calc non Af Amer 33 (*)    GFR calc Af Amer 39 (*)    All other components within normal limits  CBG MONITORING, ED - Abnormal; Notable for the following:    Glucose-Capillary 193 (*)    All other components within normal limits  PROTIME-INR  DIFFERENTIAL  I-STAT TROPOININ, ED   Imaging Review Ct Head Wo Contrast  04/09/2013   CLINICAL DATA:  80 year old male with slurred speech and facial droop.  EXAM: CT HEAD WITHOUT CONTRAST  TECHNIQUE: Contiguous axial images were obtained from the base of the skull through the vertex without intravenous contrast.  COMPARISON:  None  FINDINGS: An age indeterminate infarct within the posterior left subinsular region/posterior internal capsule identified.  Atrophy and chronic small-vessel white matter ischemic changes are noted.  There is no evidence of hemorrhage, mass lesion or mass effect, hydrocephalus or extra-axial collection.  No acute bony abnormalities are identified.  IMPRESSION: Age indeterminate infarct within the posterior left subinsular regions/posterior internal capsule - likely subacute to chronic. No evidence of hemorrhage.  Atrophy and chronic small-vessel white matter ischemic changes.   Electronically Signed   By: Laveda Abbe M.D.   On: 04/09/2013 15:29     EKG Interpretation   Date/Time:  Sunday April 09 2013 13:31:23 EDT Ventricular Rate:  61 PR  Interval:    QRS Duration: 78 QT Interval:  436 QTC Calculation: 438 R Axis:   -3 Text Interpretation:  Atrial fibrillation ST \\T \ T wave abnormality,  consider lateral ischemia Abnormal ECG No significant change was found  Confirmed by Shelvia Fojtik  MD, Caryn Bee ([id number]) on 04/09/2013 2:04:26 PM      MDM   Final diagnoses:  TIA (transient ischemic attack)    Likely TIA given resolution of symptoms.  Neurology will assist in the evaluation that the patient will likely need admission to the hospital for standard TIA workup.  Swallow study now.    Lyanne Co, MD 04/09/13 1536

## 2013-04-10 ENCOUNTER — Inpatient Hospital Stay (HOSPITAL_COMMUNITY): Payer: Medicare HMO

## 2013-04-10 ENCOUNTER — Encounter (HOSPITAL_COMMUNITY): Payer: Self-pay | Admitting: Interventional Cardiology

## 2013-04-10 DIAGNOSIS — G459 Transient cerebral ischemic attack, unspecified: Secondary | ICD-10-CM

## 2013-04-10 DIAGNOSIS — I441 Atrioventricular block, second degree: Secondary | ICD-10-CM

## 2013-04-10 DIAGNOSIS — I517 Cardiomegaly: Secondary | ICD-10-CM

## 2013-04-10 DIAGNOSIS — M7989 Other specified soft tissue disorders: Secondary | ICD-10-CM

## 2013-04-10 DIAGNOSIS — I2510 Atherosclerotic heart disease of native coronary artery without angina pectoris: Secondary | ICD-10-CM

## 2013-04-10 LAB — CBC
HEMATOCRIT: 34.5 % — AB (ref 39.0–52.0)
Hemoglobin: 12.1 g/dL — ABNORMAL LOW (ref 13.0–17.0)
MCH: 32.6 pg (ref 26.0–34.0)
MCHC: 35.1 g/dL (ref 30.0–36.0)
MCV: 93 fL (ref 78.0–100.0)
Platelets: 101 10*3/uL — ABNORMAL LOW (ref 150–400)
RBC: 3.71 MIL/uL — ABNORMAL LOW (ref 4.22–5.81)
RDW: 13.7 % (ref 11.5–15.5)
WBC: 5.5 10*3/uL (ref 4.0–10.5)

## 2013-04-10 LAB — GLUCOSE, CAPILLARY
Glucose-Capillary: 190 mg/dL — ABNORMAL HIGH (ref 70–99)
Glucose-Capillary: 177 mg/dL — ABNORMAL HIGH (ref 70–99)

## 2013-04-10 LAB — LIPID PANEL
Cholesterol: 97 mg/dL (ref 0–200)
HDL: 41 mg/dL (ref >39)
LDL Cholesterol: 47 mg/dL (ref 0–99)
TRIGLYCERIDES: 46 mg/dL (ref <150)
Total CHOL/HDL Ratio: 2.4 RATIO
VLDL: 9 mg/dL (ref 0–40)

## 2013-04-10 LAB — HEMOGLOBIN A1C
Hgb A1c MFr Bld: 7.5 % — ABNORMAL HIGH (ref <5.7)
Mean Plasma Glucose: 169 mg/dL — ABNORMAL HIGH (ref <117)

## 2013-04-10 LAB — HEPARIN LEVEL (UNFRACTIONATED)
HEPARIN UNFRACTIONATED: 0.59 [IU]/mL (ref 0.30–0.70)
Heparin Unfractionated: 0.22 IU/mL — ABNORMAL LOW (ref 0.30–0.70)

## 2013-04-10 LAB — BASIC METABOLIC PANEL
BUN: 33 mg/dL — AB (ref 6–23)
CO2: 24 mEq/L (ref 19–32)
Calcium: 9 mg/dL (ref 8.4–10.5)
Chloride: 99 mEq/L (ref 96–112)
Creatinine, Ser: 1.67 mg/dL — ABNORMAL HIGH (ref 0.50–1.35)
GFR calc Af Amer: 42 mL/min — ABNORMAL LOW (ref >90)
GFR, EST NON AFRICAN AMERICAN: 36 mL/min — AB (ref >90)
GLUCOSE: 185 mg/dL — AB (ref 70–99)
POTASSIUM: 4.5 meq/L (ref 3.7–5.3)
Sodium: 136 mEq/L — ABNORMAL LOW (ref 137–147)

## 2013-04-10 MED ORDER — METOPROLOL TARTRATE 25 MG PO TABS: 25.0000 mg | ORAL_TABLET | Freq: Every day | ORAL | Status: DC

## 2013-04-10 MED ORDER — ASPIRIN EC 325 MG PO TBEC: 325.0000 mg | DELAYED_RELEASE_TABLET | Freq: Every day | ORAL | Status: DC

## 2013-04-10 NOTE — Progress Notes (Signed)
VASCULAR LAB PRELIMINARY  PRELIMINARY  PRELIMINARY  PRELIMINARY  Bilateral lower extremity venous duplex and carotid duplex completed.    Preliminary report:   1. Venous:  Bilateral:  No evidence of DVT, superficial thrombosis, or Baker's Cyst. 2.  Carotid:  Bilateral:  1-39% ICA stenosis.  Vertebral artery flow is antegrade.      Monae Topping, RVT 04/10/2013, 12:03 PM

## 2013-04-10 NOTE — Progress Notes (Signed)
ANTICOAGULATION CONSULT NOTE - Follow Up Consult  Pharmacy Consult for Heparin  Indication: S/p stroke/TIA, undergoing workup  No Known Allergies  Patient Measurements: Height: 5\' 6"  (167.6 cm) Weight: 161 lb 6.4 oz (73.211 kg) IBW/kg (Calculated) : 63.8  Vital Signs: Temp: 97.5 F (36.4 C) (03/23 0000) Temp src: Oral (03/22 1703) BP: 130/43 mmHg (03/23 0000) Pulse Rate: 33 (03/23 0000)  Labs:  Recent Labs  04/09/13 1344 04/10/13 0035  HGB 13.3  --   HCT 37.2*  --   PLT 121*  --   APTT 40*  --   LABPROT 14.3  --   INR 1.13  --   HEPARINUNFRC  --  0.22*  CREATININE 1.79*  --     Estimated Creatinine Clearance: 28.2 ml/min (by C-G formula based on Cr of 1.79).  Assessment: 80 y/o M started on heparin while undergoing stroke/TIA work-up. HL is 0.22 on 800 units/hr of heparin. Other labs as above. No issues per RN.   Goal of Therapy:  Heparin level 0.3-0.5 units/ml Monitor platelets by anticoagulation protocol: Yes   Plan:  -Increase heparin to 900 units/hr -1000 HL -Daily CBC/HL -Monitor for bleeding  Abran DukeLedford, Dakiya Puopolo 04/10/2013,1:34 AM

## 2013-04-10 NOTE — Evaluation (Signed)
Occupational Therapy Evaluation Patient Details Name: Vernon Jackson MRN: [id number] DOB: 1927-02-11 Today's Date: 04/10/2013 Time: 4098-1191 OT Time Calculation (min): 56 min  OT Assessment / Plan / Recommendation History of present illness Pt. is 80 y.o. male who was hospitalized with L facial droop and slurred speech. Pt. has PMH: HTN, CAD with EF 60%, DM, CABG times 4 in 2011.    Clinical Impression   Pt. Is having difficulty with standing balance during ADLs and mobility. Pt. States his balance is worse today. PT needs to eval for mobility status. Pt. Having difficulty with LE ADLs and was ed. On use of AE. Pt. Able to don socks with AE at Min A level while sitting EOB. Pt. Would benefit from further skilled OT services.     OT Assessment  Patient needs continued OT Services    Follow Up Recommendations  Home health OT    Barriers to Discharge      Equipment Recommendations  3 in 1 bedside comode;Tub/shower seat    Recommendations for Other Services    Frequency  Min 2X/week    Precautions / Restrictions Precautions Precautions: Fall Restrictions Weight Bearing Restrictions: No   Pertinent Vitals/Pain No c/o pain    ADL  Eating/Feeding: Independent;Performed Where Assessed - Eating/Feeding: Bed level Grooming: Performed;Wash/dry hands;Wash/dry face;Supervision/safety Where Assessed - Grooming: Unsupported standing Upper Body Bathing: Simulated;Set up Where Assessed - Upper Body Bathing: Unsupported sitting Lower Body Bathing: Simulated;Supervision/safety Where Assessed - Lower Body Bathing: Unsupported sitting;Unsupported standing Upper Body Dressing: Simulated;Set up Where Assessed - Upper Body Dressing: Unsupported sitting Lower Body Dressing: Performed;Supervision/safety Where Assessed - Lower Body Dressing: Unsupported sitting;Unsupported standing Toilet Transfer: Performed;Min guard Statistician Method: Surveyor, minerals: Regular  height toilet;Grab bars Toileting - Clothing Manipulation and Hygiene: Performed;Min guard Where Assessed - Engineer, mining and Hygiene: Standing ADL Comments: Pt. is unsteady with gait AMB to bathroom. Pt. has decreasead control with decent onto toilet. Pt. has decreased balance with turn. s    OT Diagnosis: Generalized weakness  OT Problem List: Decreased activity tolerance;Impaired balance (sitting and/or standing);Decreased knowledge of use of DME or AE OT Treatment Interventions: Self-care/ADL training;Neuromuscular education;DME and/or AE instruction;Therapeutic activities   OT Goals(Current goals can be found in the care plan section) Acute Rehab OT Goals Patient Stated Goal: go home today. OT Goal Formulation: With patient Time For Goal Achievement: 04/24/13 Potential to Achieve Goals: Good ADL Goals Pt Will Perform Grooming: Independently;standing Pt Will Perform Lower Body Bathing: with modified independence;sit to/from stand Pt Will Perform Lower Body Dressing: with modified independence;with adaptive equipment;sit to/from stand Pt Will Transfer to Toilet: with modified independence;bedside commode Pt Will Perform Toileting - Clothing Manipulation and hygiene: with modified independence;sit to/from stand  Visit Information  Last OT Received On: 04/10/13 Assistance Needed: +1 History of Present Illness: Pt. is 80 y.o. male who was hospitalized with L facial droop and slurred speech. Pt. has PMH: HTN, CAD with EF 60%, DM, CABG times 4 in 2011.        Prior Functioning     Home Living Family/patient expects to be discharged to:: Private residence Living Arrangements: Children Available Help at Discharge: Available 24 hours/day Type of Home: House Home Access: Stairs to enter [employer] of Steps: 3-4 Entrance Stairs-Rails: Can reach both;Right;Left Home Layout: Two level Alternate Level Stairs-Number of Steps: 12 Alternate Level  Stairs-Rails: Left Home Equipment: None Prior Function Level of Independence: Independent Communication Communication: No difficulties  Vision/Perception Vision - History Baseline Vision: Wears glasses all the time Patient Visual Report: No change from baseline   Cognition  Cognition Arousal/Alertness: Awake/alert Behavior During Therapy: WFL for tasks assessed/performed Overall Cognitive Status: Within Functional Limits for tasks assessed    Extremity/Trunk Assessment Upper Extremity Assessment Upper Extremity Assessment: Overall WFL for tasks assessed     Mobility Transfers Overall transfer level: Needs assistance Transfers: Stand Pivot Transfers Stand pivot transfers: Min guard     Exercise     Balance     End of Session OT - End of Session Activity Tolerance: Patient tolerated treatment well Patient left: in bed;with call bell/phone within reach;with bed alarm set  GO     Atlas Kuc 04/10/2013, 12:54 PM

## 2013-04-10 NOTE — Progress Notes (Signed)
Stroke Team Progress Note   SUBJECTIVE His wife is at the bedside.  Overall he feels his condition is stable.   OBJECTIVE Most recent Vital Signs: Filed Vitals:   04/10/13 0155 04/10/13 0400 04/10/13 0600 04/10/13 1000  BP: 125/44 151/91 143/63 121/63  Pulse: 62 33 106 121  Temp: 98.1 F (36.7 C) 97.8 F (36.6 C) 97.6 F (36.4 C) 98.3 F (36.8 C)  TempSrc:    Oral  Resp: 12 16 16 16   Height:      Weight:      SpO2: 98% 97% 95% 97%   CBG (last 3)   Recent Labs  04/09/13 1341 04/09/13 1710 04/09/13 2047  GLUCAP 193* 179* 173*    IV Fluid Intake:   . heparin 900 Units/hr (04/10/13 0143)    MEDICATIONS  . aspirin EC  81 mg Oral Daily  . furosemide  40 mg Oral Daily  . insulin aspart  0-9 Units Subcutaneous TID WC  . metoprolol  50 mg Oral Daily  . simvastatin  20 mg Oral QPM   PRN:  hydrALAZINE, senna-docusate, zolpidem   CLINICALLY SIGNIFICANT STUDIES Basic Metabolic Panel:  Recent Labs Lab 04/09/13 1344  NA 136*  K 4.5  CL 97  CO2 22  GLUCOSE 189*  BUN 37*  CREATININE 1.79*  CALCIUM 9.4   Liver Function Tests:  Recent Labs Lab 04/09/13 1344  AST 33  ALT 25  ALKPHOS 75  BILITOT 0.9  PROT 8.1  ALBUMIN 4.2   CBC:  Recent Labs Lab 04/09/13 1344  WBC 7.6  NEUTROABS 3.7  HGB 13.3  HCT 37.2*  MCV 92.5  PLT 121*   Coagulation:  Recent Labs Lab 04/09/13 1344  LABPROT 14.3  INR 1.13   Cardiac Enzymes: No results found for this basename: CKTOTAL, CKMB, CKMBINDEX, TROPONINI,  in the last 168 hours Urinalysis: No results found for this basename: COLORURINE, APPERANCEUR, LABSPEC, PHURINE, GLUCOSEU, HGBUR, BILIRUBINUR, KETONESUR, PROTEINUR, UROBILINOGEN, NITRITE, LEUKOCYTESUR,  in the last 168 hours Lipid Panel    Component Value Date/Time   CHOL 97 04/10/2013 0035   TRIG 46 04/10/2013 0035   HDL 41 04/10/2013 0035   CHOLHDL 2.4 04/10/2013 0035   VLDL 9 04/10/2013 0035   LDLCALC 47 04/10/2013 0035   HgbA1C  Lab Results  Component Value  Date   HGBA1C  Value: 8.2 (NOTE) The ADA recommends the following therapeutic goal for glycemic control related to Hgb A1c measurement: Goal of therapy: <6.5 Hgb A1c  Reference: American Diabetes Association: Clinical Practice Recommendations 2010, Diabetes Care, 2010, 33: (Suppl  1).* 03/17/2009    Urine Drug Screen:   No results found for this basename: labopia, cocainscrnur, labbenz, amphetmu, thcu, labbarb    Alcohol Level: No results found for this basename: ETH,  in the last 168 hours  Ct Head Wo Contrast  04/09/2013   CLINICAL DATA:  80 year old male with slurred speech and facial droop.  EXAM: CT HEAD WITHOUT CONTRAST  TECHNIQUE: Contiguous axial images were obtained from the base of the skull through the vertex without intravenous contrast.  COMPARISON:  None  FINDINGS: An age indeterminate infarct within the posterior left subinsular region/posterior internal capsule identified.  Atrophy and chronic small-vessel white matter ischemic changes are noted.  There is no evidence of hemorrhage, mass lesion or mass effect, hydrocephalus or extra-axial collection.  No acute bony abnormalities are identified.  IMPRESSION: Age indeterminate infarct within the posterior left subinsular regions/posterior internal capsule - likely subacute to chronic. No evidence of  hemorrhage.  Atrophy and chronic small-vessel white matter ischemic changes.   Electronically Signed   By: Laveda Abbe M.D.   On: 04/09/2013 15:29   Mr Brain Wo Contrast  04/10/2013   CLINICAL DATA:  80 year old male with slurred speech and left facial weakness. Initial encounter.  EXAM: MRI HEAD WITHOUT CONTRAST  MRA HEAD WITHOUT CONTRAST  TECHNIQUE: Multiplanar, multiecho pulse sequences of the brain and surrounding structures were obtained without intravenous contrast. Angiographic images of the head were obtained using MRA technique without contrast.  COMPARISON:  Head CT without contrast 04/09/2013.  FINDINGS: MRI HEAD FINDINGS  Study is  intermittently degraded by motion artifact despite repeated imaging attempts.  Stable cerebral volume. No restricted diffusion to suggest acute infarction. No midline shift, mass effect, evidence of mass lesion, ventriculomegaly, extra-axial collection or acute intracranial hemorrhage. Cervicomedullary junction and pituitary are within normal limits. Negative for age visualized cervical spine. Normal bone marrow signal. Major intracranial vascular flow voids are preserved.  Chronic lacunar infarct posterior left lentiform nuclei and external capsule with mild gliosis. Elsewhere normal for age gray and white matter signal, with no cortical encephalomalacia identified.  Postoperative changes to the globes. Visualized paranasal sinuses and mastoids are clear. Visible internal auditory structures appear normal. Visualized scalp soft tissues are within normal limits.  MRA HEAD FINDINGS  Antegrade flow in the posterior circulation with codominant distal vertebral arteries. Normal PICA origins. Normal vertebrobasilar junction. No basilar stenosis. SCA and PCA origins are normal. Normal left posterior communicating artery, while the right is diminutive or absent. Mildly irregular left PCA P2 segment. Otherwise normal bilateral PCA branches.  Antegrade flow in both ICA siphons. Cavernous and supra clinoid segment irregularity in keeping with atherosclerosis, but no ICA stenosis. Ophthalmic and posterior communicating artery origins are normal. Patent carotid termini. MCA and ACA origins are within normal limits.  Normal anterior communicating artery and visualized ACA branches. Normal left MCA M1 segment and visualized left MCA branches.  Right MCA M1 segment is patent. There is irregularity at the right carotid bifurcations suggesting atherosclerosis and a degree of stenosis. There is also stenosis in the proximal aspect of the dominant right posterior MCA sylvian division (series 606, image 13 and series 6, image 86).  There is preserved distal flow. No major right MCA branch occlusion.  IMPRESSION: 1. No acute intracranial abnormality. Mild for age chronic small vessel ischemia. 2. Intracranial atherosclerosis with stenosis at the right MCA bifurcation and in the proximal aspect of the dominant posterior M2 branch (series 606, image 13). No major circle of Willis branch occlusion.   Electronically Signed   By: Augusto Gamble M.D.   On: 04/10/2013 10:53   Mr Maxine Glenn Head/brain Wo Cm  04/10/2013   CLINICAL DATA:  80 year old male with slurred speech and left facial weakness. Initial encounter.  EXAM: MRI HEAD WITHOUT CONTRAST  MRA HEAD WITHOUT CONTRAST  TECHNIQUE: Multiplanar, multiecho pulse sequences of the brain and surrounding structures were obtained without intravenous contrast. Angiographic images of the head were obtained using MRA technique without contrast.  COMPARISON:  Head CT without contrast 04/09/2013.  FINDINGS: MRI HEAD FINDINGS  Study is intermittently degraded by motion artifact despite repeated imaging attempts.  Stable cerebral volume. No restricted diffusion to suggest acute infarction. No midline shift, mass effect, evidence of mass lesion, ventriculomegaly, extra-axial collection or acute intracranial hemorrhage. Cervicomedullary junction and pituitary are within normal limits. Negative for age visualized cervical spine. Normal bone marrow signal. Major intracranial vascular flow voids are preserved.  Chronic lacunar infarct posterior left lentiform nuclei and external capsule with mild gliosis. Elsewhere normal for age gray and white matter signal, with no cortical encephalomalacia identified.  Postoperative changes to the globes. Visualized paranasal sinuses and mastoids are clear. Visible internal auditory structures appear normal. Visualized scalp soft tissues are within normal limits.  MRA HEAD FINDINGS  Antegrade flow in the posterior circulation with codominant distal vertebral arteries. Normal PICA  origins. Normal vertebrobasilar junction. No basilar stenosis. SCA and PCA origins are normal. Normal left posterior communicating artery, while the right is diminutive or absent. Mildly irregular left PCA P2 segment. Otherwise normal bilateral PCA branches.  Antegrade flow in both ICA siphons. Cavernous and supra clinoid segment irregularity in keeping with atherosclerosis, but no ICA stenosis. Ophthalmic and posterior communicating artery origins are normal. Patent carotid termini. MCA and ACA origins are within normal limits.  Normal anterior communicating artery and visualized ACA branches. Normal left MCA M1 segment and visualized left MCA branches.  Right MCA M1 segment is patent. There is irregularity at the right carotid bifurcations suggesting atherosclerosis and a degree of stenosis. There is also stenosis in the proximal aspect of the dominant right posterior MCA sylvian division (series 606, image 13 and series 6, image 86). There is preserved distal flow. No major right MCA branch occlusion.  IMPRESSION: 1. No acute intracranial abnormality. Mild for age chronic small vessel ischemia. 2. Intracranial atherosclerosis with stenosis at the right MCA bifurcation and in the proximal aspect of the dominant posterior M2 branch (series 606, image 13). No major circle of Willis branch occlusion.   Electronically Signed   By: Augusto Gamble M.D.   On: 04/10/2013 10:53    2D Echocardiogram  EF 60-65% with no source of embolus.   Carotid Doppler  No evidence of hemodynamically significant internal carotid artery stenosis. Vertebral artery flow is antegrade.  Lower extremity venous Dopplers no evidence of DVT or superficial thrombosis, or Baker's cyst  Therapy Recommendations home health PT and OT  Physical Exam   elderlly male not in distress.Awake alert. Afebrile. Head is nontraumatic. Neck is supple without bruit. Hearing is normal. Cardiac exam no murmur or gallop. Lungs are clear to auscultation. Distal  pulses are well felt. Neurological Exam ;  Awake  Alert oriented x 3. Normal speech and language.eye movements full without nystagmus.fundi were not visualized. Vision acuity and fields appear normal. Hearing is normal. Palatal movements are normal. Face symmetric. Tongue midline. Normal strength, tone, reflexes and coordination. Normal sensation. Gait deferred. ASSESSMENT [name] is a 80 y.o. male presenting with left facial weakness. Imaging confirms no acute infarct. Dx:  right brain TIA. On aspirin 81 mg orally every day prior to admission. Now on aspirin 81 mg orally every day and heparin for secondary stroke prevention. Patient with resultant no neuro deficits. Stroke work up completed.   CAD hypertension Diabetes, HgbA1c   LDL 47  Initial EKG reading showed atrial fibrillation, however consult with Dr. Eldridge Dace and review of EKG do not feel patient is in atrial fibrillation but in type 1 second degree heart block.  Hospital day # 1  TREATMENT/PLAN  Discontinue heparin. Aspirin for secondary stroke prevention.  Dr. Eldridge Dace to do outpatient telemetry monitoring  Annie Main, MSN, RN, ANVP-BC, AGPCNP-BC [facility] Pager: [phone] 04/10/2013 11:09 AM  I have personally obtained a history, examined the patient, evaluated imaging results, and formulated the assessment and plan of care. I agree with the above.  Pramod  Pearlean BrownieSethi, MD  To contact Stroke Continuity provider, please refer to [email or web address]. After hours, contact General Neurology

## 2013-04-10 NOTE — Progress Notes (Signed)
UR Completed Rhema Boyett Graves-Bigelow, RN,BSN [phone]  

## 2013-04-10 NOTE — Consult Note (Signed)
Admit date: 04/09/2013 Referring Physician  Dr. Sunnie Nielsen  Primary Cardiologist  Eldridge Dace Reason for Consultation  stroke  HPI: 71 rolled history of coronary artery disease who had bypass surgery several years ago. He presented with facial droop and was diagnosed with TIA. His MRI was negative. He had an irregular heart rhythm and we are asked to evaluate. He apparently received a double dose of his metoprolol per the nurse report. He had a heart rate in the 40s. Review of his monitor shows prolonged PR interval. He denies any palpitations. He denies chest discomfort. He feels that his speech is slower but he is not reporting any other deficits.     PMH:   Past Medical History  Diagnosis Date  . Coronary artery disease     Bypass graft surgeryx4 03/2009  . CAD (coronary artery disease) 03 01 2011    EF 60%, mild MR   . Hypertension   . Hyperlipidemia   . Diabetes mellitus without complication   . History of BPH   . Chest pain     /1/11 echocardiogram EF 60%, mild MR,     PSH:   Past Surgical History  Procedure Laterality Date  . Cardiac catheterization      CABG X 4 vessel complicated by Atrial fib and bradycardia    Allergies:  Review of patient's allergies indicates no known allergies. Prior to Admit Meds:   Prescriptions prior to admission  Medication Sig Dispense Refill  . amLODipine (NORVASC) 10 MG tablet Take 1 tablet (10 mg total) by mouth daily.  30 tablet  11  . cloNIDine (CATAPRES) 0.1 MG tablet Take 1 tablet (0.1 mg total) by mouth 2 (two) times daily.      . furosemide (LASIX) 40 MG tablet take 1 tablet by mouth ONLY AS NEEDED  30 tablet  6  . insulin aspart protamine- aspart (NOVOLOG MIX 70/30) (70-30) 100 UNIT/ML injection Inject 20-30 Units into the skin 2 (two) times daily with a meal.       . simvastatin (ZOCOR) 20 MG tablet Take 20 mg by mouth every evening.      . zolpidem (AMBIEN) 5 MG tablet Take 2.5-5 mg by mouth at bedtime as needed for sleep.      .  [DISCONTINUED] aspirin EC 81 MG tablet Take 81 mg by mouth daily.      . [DISCONTINUED] metoprolol (LOPRESSOR) 50 MG tablet Take 50 mg by mouth daily.       . [DISCONTINUED] spironolactone (ALDACTONE) 50 MG tablet Take 50 mg by mouth daily.        Fam HX:    Family History  Problem Relation Age of Onset  . Diabetes Father   . Diabetes Brother   . Diabetes Brother   . Diabetes Son    Social HX:    History   Social History  . Marital Status: Single    Spouse Name: N/A    Number of Children: N/A  . Years of Education: N/A   Occupational History  . Not on file.   Social History Main Topics  . Smoking status: Former Games developer  . Smokeless tobacco: Not on file  . Alcohol Use: No  . Drug Use: No  . Sexual Activity: Not on file   Other Topics Concern  . 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 rate 16, height 5\' 6"  (1.676 m), weight 161 lb 6.4 oz (73.211 kg), SpO2 97.00%.    General: Well developed, well nourished, in no acute distress, slower speech Head:    Normal cephalic and atramatic  Lungs:   Clear bilaterally to auscultation and percussion. Heart:   HRRR S1 S2  No JVD.   Abdomen:  abdomen soft and non-tender  Extremities:  Trace bilateral edema.   Neuro: Alert and oriented X 3. Psych:  Normal affect, responds appropriately    Labs:   Lab Results  Component Value Date   WBC 5.5 04/10/2013   HGB 12.1* 04/10/2013   HCT 34.5* 04/10/2013   MCV 93.0 04/10/2013   PLT 101* 04/10/2013    Recent Labs Lab 04/09/13 1344 04/10/13 1031  NA 136* 136*  K 4.5 4.5  CL 97 99  CO2 22 24  BUN 37* 33*  CREATININE 1.79* 1.67*  CALCIUM 9.4 9.0  PROT 8.1  --   BILITOT 0.9  --   ALKPHOS 75  --   ALT 25  --   AST 33  --     GLUCOSE 189* 185*   No results found for this basename: PTT   Lab Results  Component Value Date   INR 1.13 04/09/2013   INR 1.67* 03/20/2009   INR 1.15 03/20/2009   Lab Results  Component Value Date   CKTOTAL 142 03/18/2009   CKMB 3.5 03/18/2009   TROPONINI  Value: 0.07        PERSISTENTLY INCREASED TROPONIN VALUES IN THE RANGE OF 0.06-0.49 ng/mL CAN BE SEEN IN:       -UNSTABLE ANGINA       -CONGESTIVE HEART FAILURE       -MYOCARDITIS       -CHEST TRAUMA       -ARRYHTHMIAS       -LATE PRESENTING MI       -COPD   CLINICAL FOLLOW-UP RECOMMENDED.* 03/18/2009     Lab Results  Component Value Date   CHOL 97 04/10/2013   CHOL  Value: 185        ATP III CLASSIFICATION:  <200     mg/dL   Desirable  161-096  mg/dL   Borderline High  >=045    mg/dL   High        04/27/8117   Lab Results  Component Value Date   HDL 41 04/10/2013   HDL 35* 03/17/2009   Lab Results  Component Value Date   LDLCALC 47 04/10/2013   LDLCALC  Value: 114        Total Cholesterol/HDL:CHD Risk Coronary Heart Disease Risk Table                     Men   Women  1/2 Average Risk   3.4   3.3  Average Risk       5.0   4.4  2 X Average Risk   9.6   7.1  3 X Average Risk  23.4   11.0        Use the calculated Patient Ratio above and the CHD Risk Table to determine the patient's CHD Risk.        ATP III CLASSIFICATION (LDL):  <100     mg/dL   Optimal  147-829  mg/dL   Near or Above  Optimal  130-159  mg/dL   Borderline  811-914  mg/dL   High  >782     mg/dL   Very High* 9/56/2130   Lab Results  Component Value Date   TRIG 46 04/10/2013   TRIG 181* 03/17/2009   Lab Results  Component Value Date   CHOLHDL 2.4 04/10/2013   CHOLHDL 5.3 03/17/2009   No results found for this basename: LDLDIRECT      Radiology:  Ct Head Wo Contrast  04/09/2013   CLINICAL DATA:  80 year old male with slurred speech and facial droop.  EXAM: CT HEAD WITHOUT CONTRAST  TECHNIQUE: Contiguous axial images were obtained from the base of the  skull through the vertex without intravenous contrast.  COMPARISON:  None  FINDINGS: An age indeterminate infarct within the posterior left subinsular region/posterior internal capsule identified.  Atrophy and chronic small-vessel white matter ischemic changes are noted.  There is no evidence of hemorrhage, mass lesion or mass effect, hydrocephalus or extra-axial collection.  No acute bony abnormalities are identified.  IMPRESSION: Age indeterminate infarct within the posterior left subinsular regions/posterior internal capsule - likely subacute to chronic. No evidence of hemorrhage.  Atrophy and chronic small-vessel white matter ischemic changes.   Electronically Signed   By: Laveda Abbe M.D.   On: 04/09/2013 15:29   Mr Brain Wo Contrast  04/10/2013   CLINICAL DATA:  80 year old male with slurred speech and left facial weakness. Initial encounter.  EXAM: MRI HEAD WITHOUT CONTRAST  MRA HEAD WITHOUT CONTRAST  TECHNIQUE: Multiplanar, multiecho pulse sequences of the brain and surrounding structures were obtained without intravenous contrast. Angiographic images of the head were obtained using MRA technique without contrast.  COMPARISON:  Head CT without contrast 04/09/2013.  FINDINGS: MRI HEAD FINDINGS  Study is intermittently degraded by motion artifact despite repeated imaging attempts.  Stable cerebral volume. No restricted diffusion to suggest acute infarction. No midline shift, mass effect, evidence of mass lesion, ventriculomegaly, extra-axial collection or acute intracranial hemorrhage. Cervicomedullary junction and pituitary are within normal limits. Negative for age visualized cervical spine. Normal bone marrow signal. Major intracranial vascular flow voids are preserved.  Chronic lacunar infarct posterior left lentiform nuclei and external capsule with mild gliosis. Elsewhere normal for age gray and white matter signal, with no cortical encephalomalacia identified.  Postoperative changes to the globes.  Visualized paranasal sinuses and mastoids are clear. Visible internal auditory structures appear normal. Visualized scalp soft tissues are within normal limits.  MRA HEAD FINDINGS  Antegrade flow in the posterior circulation with codominant distal vertebral arteries. Normal PICA origins. Normal vertebrobasilar junction. No basilar stenosis. SCA and PCA origins are normal. Normal left posterior communicating artery, while the right is diminutive or absent. Mildly irregular left PCA P2 segment. Otherwise normal bilateral PCA branches.  Antegrade flow in both ICA siphons. Cavernous and supra clinoid segment irregularity in keeping with atherosclerosis, but no ICA stenosis. Ophthalmic and posterior communicating artery origins are normal. Patent carotid termini. MCA and ACA origins are within normal limits.  Normal anterior communicating artery and visualized ACA branches. Normal left MCA M1 segment and visualized left MCA branches.  Right MCA M1 segment is patent. There is irregularity at the right carotid bifurcations suggesting atherosclerosis and a degree of stenosis. There is also stenosis in the proximal aspect of the dominant right posterior MCA sylvian division (series 606, image 13 and series 6, image 86). There is preserved distal flow. No major right MCA branch occlusion.  IMPRESSION: 1. No acute intracranial abnormality. Mild  for age chronic small vessel ischemia. 2. Intracranial atherosclerosis with stenosis at the right MCA bifurcation and in the proximal aspect of the dominant posterior M2 branch (series 606, image 13). No major circle of Willis branch occlusion.   Electronically Signed   By: Augusto GambleLee  Hall M.D.   On: 04/10/2013 10:53   Mr Maxine GlennMra Head/brain Wo Cm  04/10/2013   CLINICAL DATA:  80 year old male with slurred speech and left facial weakness. Initial encounter.  EXAM: MRI HEAD WITHOUT CONTRAST  MRA HEAD WITHOUT CONTRAST  TECHNIQUE: Multiplanar, multiecho pulse sequences of the brain and surrounding  structures were obtained without intravenous contrast. Angiographic images of the head were obtained using MRA technique without contrast.  COMPARISON:  Head CT without contrast 04/09/2013.  FINDINGS: MRI HEAD FINDINGS  Study is intermittently degraded by motion artifact despite repeated imaging attempts.  Stable cerebral volume. No restricted diffusion to suggest acute infarction. No midline shift, mass effect, evidence of mass lesion, ventriculomegaly, extra-axial collection or acute intracranial hemorrhage. Cervicomedullary junction and pituitary are within normal limits. Negative for age visualized cervical spine. Normal bone marrow signal. Major intracranial vascular flow voids are preserved.  Chronic lacunar infarct posterior left lentiform nuclei and external capsule with mild gliosis. Elsewhere normal for age gray and white matter signal, with no cortical encephalomalacia identified.  Postoperative changes to the globes. Visualized paranasal sinuses and mastoids are clear. Visible internal auditory structures appear normal. Visualized scalp soft tissues are within normal limits.  MRA HEAD FINDINGS  Antegrade flow in the posterior circulation with codominant distal vertebral arteries. Normal PICA origins. Normal vertebrobasilar junction. No basilar stenosis. SCA and PCA origins are normal. Normal left posterior communicating artery, while the right is diminutive or absent. Mildly irregular left PCA P2 segment. Otherwise normal bilateral PCA branches.  Antegrade flow in both ICA siphons. Cavernous and supra clinoid segment irregularity in keeping with atherosclerosis, but no ICA stenosis. Ophthalmic and posterior communicating artery origins are normal. Patent carotid termini. MCA and ACA origins are within normal limits.  Normal anterior communicating artery and visualized ACA branches. Normal left MCA M1 segment and visualized left MCA branches.  Right MCA M1 segment is patent. There is irregularity at the  right carotid bifurcations suggesting atherosclerosis and a degree of stenosis. There is also stenosis in the proximal aspect of the dominant right posterior MCA sylvian division (series 606, image 13 and series 6, image 86). There is preserved distal flow. No major right MCA branch occlusion.  IMPRESSION: 1. No acute intracranial abnormality. Mild for age chronic small vessel ischemia. 2. Intracranial atherosclerosis with stenosis at the right MCA bifurcation and in the proximal aspect of the dominant posterior M2 branch (series 606, image 13). No major circle of Willis branch occlusion.   Electronically Signed   By: Augusto GambleLee  Hall M.D.   On: 04/10/2013 10:53    EKG:  Sinus rhythm with prolonged PR interval, type 1 second degree AV block  ASSESSMENT: CVA, Wenckebach, CAD  PLAN:  Will discontinue beta blocker at the time of discharge. His heart rate has been in the 50s despite the nurse holding his beta blocker today.  Watch blood pressure. I do not see any evidence of atrial fibrillation on his telemetry. We'll plan for 30 day continuous telemetry outpatient monitoring.  I stressed the importance of the patient for ruling out atrial fibrillation given his TIA symptoms. If he indeed had atrial fibrillation, he would be a high-risk of recurrent stroke. Therefore, anticoagulation would be indicated. We'll wait on  monitor results.  CAD: No angina. Continue aspirin for now.  Corky Crafts., MD  04/10/2013  2:24 PM

## 2013-04-10 NOTE — Discharge Summary (Signed)
Physician Discharge Summary  ETHEN BANNAN ZDG:[id number] DOB: October 09, 1927 DOA: 04/09/2013  PCP: Garnette Czech, MD  Admit date: 04/09/2013 Discharge date: 04/10/2013  Time spent: 35 minutes  Recommendations for Outpatient Follow-up:  1. Follow up with Dr Irish Lack for 30 days telemetry monitor. 2. Need B-met to follow renal function.   Discharge Diagnoses:    TIA   CAD (coronary artery disease)   Hypertension   Diabetes mellitus without complication    Discharge Condition: Stable.   Diet recommendation: Heart Healthy.   Filed Weights   04/09/13 1337 04/09/13 1703  Weight: 68.04 kg (150 lb) 73.211 kg (161 lb 6.4 oz)    History of present illness:  Vernon Jackson is a 80 y.o. male with PMH significant for Hypertension, CAD, Diabetes, CAD who presents after an episode of slurred speech and left facial weakness. He relates that symptoms happen around 10 Am. He was at church when happen. Church member notice change on his speech and drooling. Patient symptoms resolved before 12 noon. He is back to baseline now. He has notice changes on his speech for last few month, slow speech.  He denies palpitation, chest pain or dyspnea. He has notice increase swelling of lower extremities for last few weeks. He just got refill for lasix   Hospital Course:  1-TIA; patient peresent with slurred speech. Symptoms has resolved. EKG read initially rhythm A fib. Patient was started on heparin stroke protocol. When MRI negative. Heparin was discontinue. Cardiology was consulted. Dr Irish Lack think patient is not in A fib. Patient with type 1 second degree block.  Metoprolol was discontinue. Stroke work up unrevealing. Dr Irish Lack will arrange 30 day telemetry monitor. Neuro recommend aspirin if no evidence of A Fib.   2-Chronic renal failure; continue with lasix. Cr stable.   Procedures: ECHO: Left ventricle: The cavity size was normal. Systolic function was normal. The estimated ejection fraction  was in the range of 60% to 65%. Wall motion was normal; there were no regional wall motion abnormalities. - Left atrium: The atrium was mildly dilated. - Right atrium: The atrium was moderately dilated. - Atrial septum: No defect or patent foramen ovale was identified.  1. Venous: Bilateral: No evidence of DVT, superficial thrombosis, or Baker's Cyst.  2. Carotid: Bilateral: 1-39% ICA stenosis. Vertebral artery flow is antegrade.   Consultations:  Neurology  Cardiology  Discharge Exam: Filed Vitals:   04/10/13 1200  BP: 158/61  Pulse: 55  Temp: 98.3 F (36.8 C)  Resp: 16    General: No distress.  Cardiovascular: S 1, S RRR Respiratory: CTA  Discharge Instructions  Discharge Orders   Future Orders Complete By Expires   Diet - low sodium heart healthy  As directed    Increase activity slowly  As directed        Medication List    STOP taking these medications       spironolactone 50 MG tablet  Commonly known as:  ALDACTONE      TAKE these medications       amLODipine 10 MG tablet  Commonly known as:  NORVASC  Take 1 tablet (10 mg total) by mouth daily.     aspirin EC 325 MG tablet  Take 1 tablet (325 mg total) by mouth daily.     cloNIDine 0.1 MG tablet  Commonly known as:  CATAPRES  Take 1 tablet (0.1 mg total) by mouth 2 (two) times daily.     furosemide 40 MG tablet  Commonly  known as:  LASIX  take 1 tablet by mouth ONLY AS NEEDED     insulin aspart protamine- aspart (70-30) 100 UNIT/ML injection  Commonly known as:  NOVOLOG MIX 70/30  Inject 20-30 Units into the skin 2 (two) times daily with a meal.     metoprolol tartrate 25 MG tablet  Commonly known as:  LOPRESSOR  Take 1 tablet (25 mg total) by mouth daily.  Start taking on:  04/11/2013     simvastatin 20 MG tablet  Commonly known as:  ZOCOR  Take 20 mg by mouth every evening.     zolpidem 5 MG tablet  Commonly known as:  AMBIEN  Take 2.5-5 mg by mouth at bedtime as needed for  sleep.       No Known Allergies     Follow-up Information   Follow up with HOOPER,JEFFREY C, MD In 1 week.   Specialty:  Geriatric Medicine   Contact information:   [place] [id number] [phone]        The results of significant diagnostics from this hospitalization (including imaging, microbiology, ancillary and laboratory) are listed below for reference.    Significant Diagnostic Studies: Ct Head Wo Contrast  04/09/2013   CLINICAL DATA:  80 year old male with slurred speech and facial droop.  EXAM: CT HEAD WITHOUT CONTRAST  TECHNIQUE: Contiguous axial images were obtained from the base of the skull through the vertex without intravenous contrast.  COMPARISON:  None  FINDINGS: An age indeterminate infarct within the posterior left subinsular region/posterior internal capsule identified.  Atrophy and chronic small-vessel white matter ischemic changes are noted.  There is no evidence of hemorrhage, mass lesion or mass effect, hydrocephalus or extra-axial collection.  No acute bony abnormalities are identified.  IMPRESSION: Age indeterminate infarct within the posterior left subinsular regions/posterior internal capsule - likely subacute to chronic. No evidence of hemorrhage.  Atrophy and chronic small-vessel white matter ischemic changes.   Electronically Signed   By: Hassan Rowan M.D.   On: 04/09/2013 15:29   Mr Brain Wo Contrast  04/10/2013   CLINICAL DATA:  80 year old male with slurred speech and left facial weakness. Initial encounter.  EXAM: MRI HEAD WITHOUT CONTRAST  MRA HEAD WITHOUT CONTRAST  TECHNIQUE: Multiplanar, multiecho pulse sequences of the brain and surrounding structures were obtained without intravenous contrast. Angiographic images of the head were obtained using MRA technique without contrast.  COMPARISON:  Head CT without contrast 04/09/2013.  FINDINGS: MRI HEAD FINDINGS  Study is intermittently degraded by motion artifact despite repeated imaging  attempts.  Stable cerebral volume. No restricted diffusion to suggest acute infarction. No midline shift, mass effect, evidence of mass lesion, ventriculomegaly, extra-axial collection or acute intracranial hemorrhage. Cervicomedullary junction and pituitary are within normal limits. Negative for age visualized cervical spine. Normal bone marrow signal. Major intracranial vascular flow voids are preserved.  Chronic lacunar infarct posterior left lentiform nuclei and external capsule with mild gliosis. Elsewhere normal for age gray and white matter signal, with no cortical encephalomalacia identified.  Postoperative changes to the globes. Visualized paranasal sinuses and mastoids are clear. Visible internal auditory structures appear normal. Visualized scalp soft tissues are within normal limits.  MRA HEAD FINDINGS  Antegrade flow in the posterior circulation with codominant distal vertebral arteries. Normal PICA origins. Normal vertebrobasilar junction. No basilar stenosis. SCA and PCA origins are normal. Normal left posterior communicating artery, while the right is diminutive or absent. Mildly irregular left PCA P2 segment. Otherwise normal bilateral PCA branches.  Antegrade flow in both ICA siphons. Cavernous and supra clinoid segment irregularity in keeping with atherosclerosis, but no ICA stenosis. Ophthalmic and posterior communicating artery origins are normal. Patent carotid termini. MCA and ACA origins are within normal limits.  Normal anterior communicating artery and visualized ACA branches. Normal left MCA M1 segment and visualized left MCA branches.  Right MCA M1 segment is patent. There is irregularity at the right carotid bifurcations suggesting atherosclerosis and a degree of stenosis. There is also stenosis in the proximal aspect of the dominant right posterior MCA sylvian division (series 606, image 13 and series 6, image 86). There is preserved distal flow. No major right MCA branch occlusion.   IMPRESSION: 1. No acute intracranial abnormality. Mild for age chronic small vessel ischemia. 2. Intracranial atherosclerosis with stenosis at the right MCA bifurcation and in the proximal aspect of the dominant posterior M2 branch (series 606, image 13). No major circle of Willis branch occlusion.   Electronically Signed   By: Lars Pinks M.D.   On: 04/10/2013 10:53   Mr Jodene Nam Head/brain Wo Cm  04/10/2013   CLINICAL DATA:  80 year old male with slurred speech and left facial weakness. Initial encounter.  EXAM: MRI HEAD WITHOUT CONTRAST  MRA HEAD WITHOUT CONTRAST  TECHNIQUE: Multiplanar, multiecho pulse sequences of the brain and surrounding structures were obtained without intravenous contrast. Angiographic images of the head were obtained using MRA technique without contrast.  COMPARISON:  Head CT without contrast 04/09/2013.  FINDINGS: MRI HEAD FINDINGS  Study is intermittently degraded by motion artifact despite repeated imaging attempts.  Stable cerebral volume. No restricted diffusion to suggest acute infarction. No midline shift, mass effect, evidence of mass lesion, ventriculomegaly, extra-axial collection or acute intracranial hemorrhage. Cervicomedullary junction and pituitary are within normal limits. Negative for age visualized cervical spine. Normal bone marrow signal. Major intracranial vascular flow voids are preserved.  Chronic lacunar infarct posterior left lentiform nuclei and external capsule with mild gliosis. Elsewhere normal for age gray and white matter signal, with no cortical encephalomalacia identified.  Postoperative changes to the globes. Visualized paranasal sinuses and mastoids are clear. Visible internal auditory structures appear normal. Visualized scalp soft tissues are within normal limits.  MRA HEAD FINDINGS  Antegrade flow in the posterior circulation with codominant distal vertebral arteries. Normal PICA origins. Normal vertebrobasilar junction. No basilar stenosis. SCA and PCA  origins are normal. Normal left posterior communicating artery, while the right is diminutive or absent. Mildly irregular left PCA P2 segment. Otherwise normal bilateral PCA branches.  Antegrade flow in both ICA siphons. Cavernous and supra clinoid segment irregularity in keeping with atherosclerosis, but no ICA stenosis. Ophthalmic and posterior communicating artery origins are normal. Patent carotid termini. MCA and ACA origins are within normal limits.  Normal anterior communicating artery and visualized ACA branches. Normal left MCA M1 segment and visualized left MCA branches.  Right MCA M1 segment is patent. There is irregularity at the right carotid bifurcations suggesting atherosclerosis and a degree of stenosis. There is also stenosis in the proximal aspect of the dominant right posterior MCA sylvian division (series 606, image 13 and series 6, image 86). There is preserved distal flow. No major right MCA branch occlusion.  IMPRESSION: 1. No acute intracranial abnormality. Mild for age chronic small vessel ischemia. 2. Intracranial atherosclerosis with stenosis at the right MCA bifurcation and in the proximal aspect of the dominant posterior M2 branch (series 606, image 13). No major circle of Willis branch occlusion.   Electronically Signed  By: Lars Pinks M.D.   On: 04/10/2013 10:53    Microbiology: No results found for this or any previous visit (from the past 240 hour(s)).   Labs: Basic Metabolic Panel:  Recent Labs Lab 04/09/13 1344 04/10/13 1031  NA 136* 136*  K 4.5 4.5  CL 97 99  CO2 22 24  GLUCOSE 189* 185*  BUN 37* 33*  CREATININE 1.79* 1.67*  CALCIUM 9.4 9.0   Liver Function Tests:  Recent Labs Lab 04/09/13 1344  AST 33  ALT 25  ALKPHOS 75  BILITOT 0.9  PROT 8.1  ALBUMIN 4.2   No results found for this basename: LIPASE, AMYLASE,  in the last 168 hours No results found for this basename: AMMONIA,  in the last 168 hours CBC:  Recent Labs Lab 04/09/13 1344  04/10/13 1031  WBC 7.6 5.5  NEUTROABS 3.7  --   HGB 13.3 12.1*  HCT 37.2* 34.5*  MCV 92.5 93.0  PLT 121* 101*   Cardiac Enzymes: No results found for this basename: CKTOTAL, CKMB, CKMBINDEX, TROPONINI,  in the last 168 hours BNP: BNP (last 3 results) No results found for this basename: PROBNP,  in the last 8760 hours CBG:  Recent Labs Lab 04/09/13 1341 04/09/13 1710 04/09/13 2047 04/10/13 1217  GLUCAP 193* 179* 173* 190*       Signed:  Regalado, Belkys A  [facility] 04/10/2013, 12:30 PM

## 2013-04-10 NOTE — Discharge Instructions (Signed)
STROKE/TIA DISCHARGE INSTRUCTIONS SMOKING Cigarette smoking nearly doubles your risk of having a stroke & is the single most alterable risk factor  If you smoke or have smoked in the last 12 months, you are advised to quit smoking for your health.  Most of the excess cardiovascular risk related to smoking disappears within a year of stopping.  Ask you doctor about anti-smoking medications  [place] Quit Line: 1-800-QUIT NOW  Free Smoking Cessation Classes [phone]  CHOLESTEROL Know your levels; limit fat & cholesterol in your diet  Lipid Panel     Component Value Date/Time   CHOL 97 04/10/2013 0035   TRIG 46 04/10/2013 0035   HDL 41 04/10/2013 0035   CHOLHDL 2.4 04/10/2013 0035   VLDL 9 04/10/2013 0035   LDLCALC 47 04/10/2013 0035      Many patients benefit from treatment even if their cholesterol is at goal.  Goal: Total Cholesterol (CHOL) less than 160  Goal:  Triglycerides (TRIG) less than 150  Goal:  HDL greater than 40  Goal:  LDL (LDLCALC) less than 100   BLOOD PRESSURE American Stroke Association blood pressure target is less that 120/80 mm/Hg  Your discharge blood pressure is:  BP: 151/63 mmHg  Monitor your blood pressure  Limit your salt and alcohol intake  Many individuals will require more than one medication for high blood pressure  DIABETES (A1c is a blood sugar average for last 3 months) Goal HGBA1c is under 7% (HBGA1c is blood sugar average for last 3 months)  Diabetes: Diagnosis of diabetes:  Your A1c:7.5 %    Lab Results  Component Value Date   HGBA1C 7.5* 04/10/2013     Your HGBA1c can be lowered with medications, healthy diet, and exercise.  Check your blood sugar as directed by your physician  Call your physician if you experience unexplained or low blood sugars.  PHYSICAL ACTIVITY/REHABILITATION Goal is 30 minutes at least 4 days per week  Activity: Increase activity slowly, Therapies: none Return to work:   Activity decreases your risk of heart  attack and stroke and makes your heart stronger.  It helps control your weight and blood pressure; helps you relax and can improve your mood.  Participate in a regular exercise program.  Talk with your doctor about the best form of exercise for you (dancing, walking, swimming, cycling).  DIET/WEIGHT Goal is to maintain a healthy weight  Your discharge diet is: Carb Control  Cardiac Your height is:  Height: 5\' 6"  (167.6 cm) Your current weight is: Weight: 73.211 kg (161 lb 6.4 oz) Your Body Mass Index (BMI) is:  BMI (Calculated): 26.1  Following the type of diet specifically designed for you will help prevent another stroke.  Your goal weight range is:    Your goal Body Mass Index (BMI) is 19-24.  Healthy food habits can help reduce 3 risk factors for stroke:  High cholesterol, hypertension, and excess weight.  RESOURCES Stroke/Support Group:  Call [phone]   STROKE EDUCATION PROVIDED/REVIEWED AND GIVEN TO PATIENT Stroke warning signs and symptoms How to activate emergency medical system (call 911). Medications prescribed at discharge. Need for follow-up after discharge. Personal risk factors for stroke. Pneumonia vaccine given: No Flu vaccine given: No My questions have been answered, the writing is legible, and I understand these instructions.  I will adhere to these goals & educational materials that have been provided to me after my discharge from the hospital.

## 2013-04-10 NOTE — Evaluation (Signed)
Physical Therapy Evaluation Patient Details Name: Vernon Jackson MRN: [id number] DOB: 02-18-1927 Today's Date: 04/10/2013 Time: 9622-2979 PT Time Calculation (min): 29 min  PT Assessment / Plan / Recommendation History of Present Illness  Pt. is 80 y.o. male who was hospitalized with L facial droop and slurred speech. Pt. has PMH: HTN, CAD with EF 60%, DM, CABG times 4 in 2011.   Clinical Impression  Patient evaluated by Physical Therapy with no further acute PT needs identified. All education has been completed and the patient has no further questions.  See below for any follow-up Physial Therapy or equipment needs. PT is signing off. Thank you for this referral.     PT Assessment  All further PT needs can be met in the next venue of care    Follow Up Recommendations  Home health PT;Supervision for mobility/OOB    Does the patient have the potential to tolerate intense rehabilitation      Barriers to Discharge        Equipment Recommendations  None recommended by PT    Recommendations for Other Services     Frequency      Precautions / Restrictions Precautions Precautions: Fall Restrictions Weight Bearing Restrictions: No   Pertinent Vitals/Pain       Mobility  Bed Mobility Overal bed mobility: Needs Assistance Bed Mobility: Supine to Sit;Sit to Supine Supine to sit: Supervision Sit to supine: Supervision General bed mobility comments: some help repositioning in bed Transfers Overall transfer level: Needs assistance Transfers: Sit to/from Stand Sit to Stand: Supervision;Min guard Stand pivot transfers: Min guard General transfer comment: tends to list R and reach out R hand to bed or armres Ambulation/Gait Ambulation/Gait assistance: Min guard Ambulation Distance (Feet): 300 Feet Assistive device: None Gait Pattern/deviations: Step-through pattern;Decreased step length - right;Decreased step length - left;Decreased stride length;Shuffle;Scissoring;Drifts  right/left;Narrow base of support Gait velocity: slow, no discernible speed change when asked Gait velocity interpretation: Below normal speed for age/gender Stairs: Yes Stairs assistance: Supervision Stair Management: One rail Right;Alternating pattern;Step to pattern;Forwards Number of Stairs: 4 General stair comments: safe on steps with use of rail    Exercises     PT Diagnosis:    PT Problem List: Decreased activity tolerance;Decreased balance;Decreased mobility;Decreased knowledge of use of DME PT Treatment Interventions:       PT Goals(Current goals can be found in the care plan section) Acute Rehab PT Goals Patient Stated Goal: go home today. PT Goal Formulation: No goals set, d/c therapy  Visit Information  Last PT Received On: 04/10/13 Assistance Needed: +1 History of Present Illness: Pt. is 80 y.o. male who was hospitalized with L facial droop and slurred speech. Pt. has PMH: HTN, CAD with EF 60%, DM, CABG times 4 in 2011.        Prior [place] expects to be discharged to:: Private residence Living Arrangements: Children Available Help at Discharge: Available 24 hours/day (24 hours a day for about 13 days `) Type of Home: House Home Access: Stairs to enter [employer] of Steps: 3-4 Entrance Stairs-Rails: Can reach both;Right;Left Home Layout: Two level Alternate Level Stairs-Number of Steps: 12 Alternate Level Stairs-Rails: Left Home Equipment: None Prior Function Level of Independence: Independent Communication Communication: No difficulties    Cognition  Cognition Arousal/Alertness: Awake/alert Behavior During Therapy: WFL for tasks assessed/performed Overall Cognitive Status: Within Functional Limits for tasks assessed    Extremity/Trunk Assessment Upper Extremity Assessment Upper Extremity Assessment: Defer to OT evaluation Lower Extremity  Assessment Lower Extremity Assessment: Overall WFL for tasks  assessed (R side noticeably weaker on R)   Balance Balance Overall balance assessment: Needs assistance Sitting-balance support: No upper extremity supported Sitting balance-Leahy Scale: Fair Standing balance support: No upper extremity supported;During functional activity Standing balance-Leahy Scale: Fair Standing balance comment: see berg or DGI Standardized Balance Assessment Standardized Balance Assessment : Berg Balance Test;Dynamic Gait Index Berg Balance Test Sit to Stand: Able to stand  independently using hands Standing Unsupported: Able to stand 30 seconds unsupported Sitting with Back Unsupported but Feet Supported on Floor or Stool: Able to sit safely and securely 2 minutes Stand to Sit: Controls descent by using hands Transfers: Able to transfer safely, definite need of hands Standing Unsupported with Eyes Closed: Able to stand 10 seconds with supervision Standing Ubsupported with Feet Together: Able to place feet together independently and stand for 1 minute with supervision From Standing, Reach Forward with Outstretched Arm: Can reach forward >12 cm safely (5") From Standing Position, Pick up Object from Floor: Able to pick up shoe, needs supervision From Standing Position, Turn to Look Behind Over each Shoulder: Looks behind one side only/other side shows less weight shift Turn 360 Degrees: Able to turn 360 degrees safely one side only in 4 seconds or less Standing Unsupported, Alternately Place Feet on Step/Stool: Able to complete >2 steps/needs minimal assist Standing Unsupported, One Foot in Front: Able to take small step independently and hold 30 seconds Standing on One Leg: Able to lift leg independently and hold equal to or more than 3 seconds Total Score: 38 Dynamic Gait Index Level Surface: Mild Impairment Change in Gait Speed: Moderate Impairment Gait with Horizontal Head Turns: Mild Impairment Gait with Vertical Head Turns: Normal Gait and Pivot Turn:  Normal Step Over Obstacle: Moderate Impairment Step Around Obstacles: Mild Impairment Steps: Mild Impairment Total Score: 16  End of Session PT - End of Session Activity Tolerance: Patient tolerated treatment well Patient left: in bed Nurse Communication: Mobility status  GP Functional Assessment Tool Used: clinical judgement Functional Limitation: Mobility: Walking and moving around Mobility: Walking and Moving Around Current Status ([id number]): At least 1 percent but less than 20 percent impaired, limited or restricted Mobility: Walking and Moving Around Goal Status [phone]): At least 1 percent but less than 20 percent impaired, limited or restricted Mobility: Walking and Moving Around Discharge Status [phone]): At least 1 percent but less than 20 percent impaired, limited or restricted   Frankey Botting, Tessie Fass 04/10/2013, 3:00 PM 04/10/2013  Donnella Sham, [place] [phone]  (pager)

## 2013-04-10 NOTE — Progress Notes (Signed)
  Echocardiogram 2D Echocardiogram has been performed.  Georgian CoWILLIAMS, Neithan Day 04/10/2013, 3:58 PM

## 2013-12-07 ENCOUNTER — Other Ambulatory Visit: Payer: Self-pay | Admitting: *Deleted

## 2013-12-07 MED ORDER — AMLODIPINE BESYLATE 10 MG PO TABS: 10.0000 mg | ORAL_TABLET | Freq: Every day | ORAL | Status: DC

## 2014-06-17 ENCOUNTER — Other Ambulatory Visit: Payer: Self-pay | Admitting: Interventional Cardiology

## 2014-06-19 ENCOUNTER — Telehealth: Payer: Self-pay | Admitting: *Deleted

## 2014-06-19 NOTE — Telephone Encounter (Signed)
Pt will need to make an appt to continue getting refills. Sent message to Dr. Hoyle BarrVaranasi's scheduler to schedule pt for an office visit.

## 2014-06-19 NOTE — Telephone Encounter (Signed)
Please advise on refill. Patient has not been seen since 2014 and has been made aware that he needs an appointment. Thanks, MI

## 2014-06-19 NOTE — Telephone Encounter (Signed)
Vernon Jackson, patient needs an appointment before we can refill the medication that the pharmacy is requesting. Could you please call him and schedule an appointment? Thanks, MI

## 2014-06-22 ENCOUNTER — Telehealth: Payer: Self-pay | Admitting: *Deleted

## 2014-06-22 ENCOUNTER — Other Ambulatory Visit: Payer: Self-pay | Admitting: *Deleted

## 2014-06-22 ENCOUNTER — Telehealth: Payer: Self-pay | Admitting: Interventional Cardiology

## 2014-06-22 MED ORDER — AMLODIPINE BESYLATE 10 MG PO TABS: 10.0000 mg | ORAL_TABLET | Freq: Every day | ORAL | Status: DC

## 2014-06-22 NOTE — Telephone Encounter (Signed)
Could someone call this patient and get him to schedule an appointment? Thanks, MI

## 2014-06-22 NOTE — Telephone Encounter (Signed)
Daughter returned call and informed daughter that we needed to schedule a follow up appt with Dr. Eldridge DaceVaranasi to continue to refill pt's meds. Daughter scheduled appt for July and will call if there is an issue with scheduled time. Refill sent in for Amlodipine to hold pt until scheduled appt.  Daughter verbalized understanding.

## 2014-06-22 NOTE — Telephone Encounter (Signed)
On 06/22/14 called to make med refill appt, phone is not working #; ah  Will remove [facility] [place] [facility]

## 2014-06-22 NOTE — Telephone Encounter (Signed)
Attempted to reach pt to schedule a follow up appt with Dr. Eldridge DaceVaranasi so that we can continue to fill pt's Amlodipine. Phone number listed for pt states that it is no longer in service. Called emergency contact number Lambert Mody(Tracie Carter-daughter) and left message to have pt call us back.

## 2014-08-14 ENCOUNTER — Ambulatory Visit (INDEPENDENT_AMBULATORY_CARE_PROVIDER_SITE_OTHER): Payer: Medicare HMO | Admitting: Interventional Cardiology

## 2014-08-14 ENCOUNTER — Encounter: Payer: Self-pay | Admitting: Interventional Cardiology

## 2014-08-14 VITALS — BP 148/64 | HR 38 | Ht 66.0 in | Wt 127.1 lb

## 2014-08-14 DIAGNOSIS — I10 Essential (primary) hypertension: Secondary | ICD-10-CM | POA: Diagnosis not present

## 2014-08-14 DIAGNOSIS — N183 Chronic kidney disease, stage 3 unspecified: Secondary | ICD-10-CM

## 2014-08-14 DIAGNOSIS — T733XXA Exhaustion due to excessive exertion, initial encounter: Secondary | ICD-10-CM

## 2014-08-14 DIAGNOSIS — I481 Persistent atrial fibrillation: Secondary | ICD-10-CM

## 2014-08-14 DIAGNOSIS — I2510 Atherosclerotic heart disease of native coronary artery without angina pectoris: Secondary | ICD-10-CM

## 2014-08-14 DIAGNOSIS — E785 Hyperlipidemia, unspecified: Secondary | ICD-10-CM | POA: Diagnosis not present

## 2014-08-14 DIAGNOSIS — I4819 Other persistent atrial fibrillation: Secondary | ICD-10-CM

## 2014-08-14 NOTE — Patient Instructions (Signed)
Medication Instructions:  Same  Labwork: CMET and TSH today  Testing/Procedures: None  Follow-Up: To be determined

## 2014-08-14 NOTE — Progress Notes (Signed)
Patient ID: Vernon Jackson, male   DOB: Sep 20, 1927, 81 y.o.   MRN: [id number]     Cardiology Office Note   Date:  08/15/2014   ID:  ISAK SOTOMAYOR, DOB [birth year], MRN [id number]  PCP:  Garnette Czech, MD    Chief Complaint  Patient presents with  . Labs Only     Wt Readings from Last 3 Encounters:  08/14/14 127 lb 1.9 oz (57.661 kg)  04/09/13 161 lb 6.4 oz (73.211 kg)  12/23/12 150 lb (68.04 kg)       History of Present Illness: Vernon Jackson is a 81 y.o. male   who has had CABG. His walking is limited by hip pain. He has not been exercising recently. CAD/ASCVD:  Denies : Chest pain.  Diaphoresis.  Dizziness.  Dyspnea on exertion.  Leg edema.  Nitroglycerin.  Orthopnea.  Palpitations.  Paroxysmal nocturnal dyspnea.  Syncope.   He admits to slowing down in recent months.  He does not check his blood pressure at home.    Past Medical History  Diagnosis Date  . Coronary artery disease     Bypass graft surgeryx4 03/2009  . CAD (coronary artery disease) 03 01 2011    EF 60%, mild MR   . Hypertension   . Hyperlipidemia   . Diabetes mellitus without complication   . History of BPH   . Chest pain     /1/11 echocardiogram EF 60%, mild MR,  . Wenckebach second degree AV block     Past Surgical History  Procedure Laterality Date  . Cardiac catheterization      CABG X 4 vessel complicated by Atrial fib and bradycardia     Current Outpatient Prescriptions  Medication Sig Dispense Refill  . amLODipine (NORVASC) 10 MG tablet Take 1 tablet (10 mg total) by mouth daily. 30 tablet 2  . aspirin EC 325 MG tablet Take 1 tablet (325 mg total) by mouth daily. 30 tablet 0  . cloNIDine (CATAPRES) 0.1 MG tablet Take 1 tablet (0.1 mg total) by mouth 2 (two) times daily.    . furosemide (LASIX) 40 MG tablet take 1 tablet by mouth ONLY AS NEEDED 30 tablet 6  . insulin aspart protamine- aspart (NOVOLOG MIX 70/30) (70-30) 100 UNIT/ML injection Inject 20-30 Units into the  skin 2 (two) times daily with a meal.     . simvastatin (ZOCOR) 20 MG tablet Take 20 mg by mouth every evening.    . zolpidem (AMBIEN) 5 MG tablet Take 2.5-5 mg by mouth at bedtime as needed for sleep.     No current facility-administered medications for this visit.    Allergies:   Review of patient's allergies indicates no known allergies.    Social History:  The patient  reports that he has quit smoking. He does not have any smokeless tobacco history on file. He reports that he does not drink alcohol or use illicit drugs.   Family History:  The patient's family history includes Diabetes in his brother, brother, father, and son.    ROS:  Please see the history of present illness.   Otherwise, review of systems are positive for some fatigue.   All other systems are reviewed and negative.    PHYSICAL EXAM: VS:  BP 148/64 mmHg  Pulse 38  Ht _0  (1.676 m)  Wt 127 lb 1.9 oz (57.661 kg)  BMI 20.53 kg/m2 , BMI Body mass index is 20.53 kg/(m^2). GEN: Well nourished, well developed, in  no acute distress HEENT: normal Neck: no JVD, carotid bruits, or masses Cardiac: Bradycardic ; no murmurs, rubs, or gallops,no edema  Respiratory:  clear to auscultation bilaterally, normal work of breathing GI: soft, nontender, nondistended, + BS MS: no deformity or atrophy Skin: warm and dry, no rash Neuro:  Strength and sensation are intact Psych: euthymic mood, full affect   EKG:   The ekg ordered today demonstrates NSR with 2:1 block   Recent Labs: 08/14/2014: ALT 11; BUN 29*; Creatinine, Ser 1.58*; Potassium 4.6; Sodium 135; TSH 1.05   Lipid Panel    Component Value Date/Time   CHOL 97 04/10/2013 0035   TRIG 46 04/10/2013 0035   HDL 41 04/10/2013 0035   CHOLHDL 2.4 04/10/2013 0035   VLDL 9 04/10/2013 0035   LDLCALC 47 04/10/2013 0035     Other studies Reviewed: Additional studies/ records that were reviewed today with results demonstrating: CABG in 2011.   ASSESSMENT AND  PLAN:  1. AV block: 2-1. I suspect this is pathologic and may be contributing to his fatigue. We'll try to wean him off clonidine to see if this helps.  He also reports dry mouth. I did explain that potentially, he could require a pacemaker. He has not had lightheadedness or syncope as of now.  2. HTN: he's not checking blood pressure at home. Given his renal insufficiency, I think an ACE inhibitor has not been used. Continue amlodipine. We'll check electrolytes today. If his creatinine is only mildly elevated, will try low-dose lisinopril in the attempts of getting him off of the clonidine.  3. CAD: Status post CABG 5 years ago. No angina.  4. CRI: Check electrolytes today to assess whether his renal function would allow for ACE inhibitor.  5. Hyperlipidemia: continue simvastatin.    Current medicines are reviewed at length with the patient today.  The patient concerns regarding his medicines were addressed.  The following changes have been made:  No change; will base medicine changes on lab values.  Labs/ tests ordered today include:   Orders Placed This Encounter  Procedures  . Comp Met (CMET)  . TSH  . EKG 12-Lead    Recommend 150 minutes/week of aerobic exercise Low fat, low carb, high fiber diet recommended  Disposition:   FU depending on what medications we change. I have informed him about symptoms of bradycardia. He will let us know if he has any of these.   Teresita Madura., MD  08/15/2014 1:08 PM    [facility] [address], [place], [place]  [address] Phone: [phone]; Fax: [phone]

## 2014-08-15 ENCOUNTER — Telehealth: Payer: Self-pay | Admitting: *Deleted

## 2014-08-15 DIAGNOSIS — I10 Essential (primary) hypertension: Secondary | ICD-10-CM

## 2014-08-15 DIAGNOSIS — N189 Chronic kidney disease, unspecified: Secondary | ICD-10-CM | POA: Insufficient documentation

## 2014-08-15 LAB — COMPREHENSIVE METABOLIC PANEL
ALBUMIN: 4.4 g/dL (ref 3.5–5.2)
ALT: 11 U/L (ref 0–53)
AST: 14 U/L (ref 0–37)
Alkaline Phosphatase: 45 U/L (ref 39–117)
BILIRUBIN TOTAL: 0.8 mg/dL (ref 0.2–1.2)
BUN: 29 mg/dL — ABNORMAL HIGH (ref 6–23)
CALCIUM: 9.3 mg/dL (ref 8.4–10.5)
CO2: 25 mEq/L (ref 19–32)
CREATININE: 1.58 mg/dL — AB (ref 0.40–1.50)
Chloride: 102 mEq/L (ref 96–112)
GFR: 44.51 mL/min — ABNORMAL LOW (ref >60.00)
Glucose, Bld: 145 mg/dL — ABNORMAL HIGH (ref 70–99)
Potassium: 4.6 mEq/L (ref 3.5–5.1)
Sodium: 135 mEq/L (ref 135–145)
Total Protein: 7.3 g/dL (ref 6.0–8.3)

## 2014-08-15 LAB — TSH: TSH: 1.05 u[IU]/mL (ref 0.35–4.50)

## 2014-08-15 MED ORDER — CLONIDINE HCL 0.1 MG PO TABS: 0.1000 mg | ORAL_TABLET | Freq: Every day | ORAL | Status: DC

## 2014-08-15 MED ORDER — LISINOPRIL 10 MG PO TABS: 10.0000 mg | ORAL_TABLET | Freq: Every day | ORAL | Status: AC

## 2014-08-15 NOTE — Telephone Encounter (Signed)
-----   Message from Corky Crafts, MD sent at 08/15/2014 12:51 PM EDT ----- Start lisinopril 10 mg daily.  BMet in one week.  Decrease clonidine to once a day.  Please have him check BP with PharmD in a week when he comes in for [facility].

## 2014-08-15 NOTE — Telephone Encounter (Signed)
Spoke with pt and informed him of lab results and new orders for Lisinopril  QD and to decrease Clonidine to QD. Informed pt that Dr. Eldridge Dace would also like to check blood work again in 1 week and have pt see Audrie Lia, [facility] for BP check in BP clinic at that time. Pt states that he will have his daughter call our office to schedule these appts because she will be the one to have to bring him. Informed pt that anytime next Wed, Thurs or Fri would be fine. Pt's daughter to call to schedule BP clinic appt and lab appt on same day.

## 2014-08-17 NOTE — Telephone Encounter (Signed)
Left message for daughter, Lambert Mody, to call back. Need to schedule lab appt and BP clinic appt on same day.

## 2014-08-17 NOTE — Telephone Encounter (Signed)
Daughter returned call. Scheduled pt for BP clinic appt and labs on 08/24/14.

## 2014-08-24 ENCOUNTER — Other Ambulatory Visit (INDEPENDENT_AMBULATORY_CARE_PROVIDER_SITE_OTHER): Payer: Medicare HMO | Admitting: *Deleted

## 2014-08-24 ENCOUNTER — Ambulatory Visit (INDEPENDENT_AMBULATORY_CARE_PROVIDER_SITE_OTHER): Payer: Medicare HMO | Admitting: Pharmacist

## 2014-08-24 VITALS — BP 132/62 | HR 60

## 2014-08-24 DIAGNOSIS — I10 Essential (primary) hypertension: Secondary | ICD-10-CM

## 2014-08-24 LAB — BASIC METABOLIC PANEL
BUN: 33 mg/dL — ABNORMAL HIGH (ref 6–23)
CO2: 22 mEq/L (ref 19–32)
Calcium: 9.6 mg/dL (ref 8.4–10.5)
Chloride: 106 mEq/L (ref 96–112)
Creatinine, Ser: 1.54 mg/dL — ABNORMAL HIGH (ref 0.40–1.50)
GFR: 45.84 mL/min — ABNORMAL LOW (ref >60.00)
GLUCOSE: 79 mg/dL (ref 70–99)
POTASSIUM: 4.7 meq/L (ref 3.5–5.1)
Sodium: 137 mEq/L (ref 135–145)

## 2014-08-24 NOTE — Progress Notes (Signed)
Patient ID: Vernon Jackson               DOB: 09/26/1927, 81 yo                  MRN: [id number]     HPI: ILIR MAHRT is a 81 y.o. male referred by Dr. Eldridge Dace to HTN clinic. PMH is significant for CAD s/p CABG x4 in 2011, HTN, HLD, DM, and BPH. Patient's clonidine was titrated down from 0.1mg  BID to daily a week ago as patient was reporting symptoms of fatigue and dry mouth, both of which could be due to clonidine. He was started on lisinopril  daily at this time as well. Patient presents today with his daughter, Jacki Cones, for follow up BMET and BP reading after recent HTN medication change.   Current HTN meds: amlodipine , clonidine 0.1mg  daily (tapering down), lisinopril  (started 1 week ago) BP goal: <150/28mmHg  BP today: 132/25mmHg, pulse 60  Family History: DM in his 2 brothers, father, and son  Social History: Patient reports that he has quit smoking. He does not drink alcohol or use illicit drugs.  Wt Readings from Last 3 Encounters:  08/14/14 127 lb 1.9 oz (57.661 kg)  04/09/13 161 lb 6.4 oz (73.211 kg)  12/23/12 150 lb (68.04 kg)   BP Readings from Last 3 Encounters:  08/24/14 132/62  08/14/14 148/64  04/10/13 151/63   Pulse Readings from Last 3 Encounters:  08/24/14 60  08/14/14 38  04/10/13 53    Renal function: Estimated Creatinine Clearance: 27.9 mL/min (by C-G formula based on Cr of 1.58).  Past Medical History  Diagnosis Date  . Coronary artery disease     Bypass graft surgeryx4 03/2009  . CAD (coronary artery disease) 03 01 2011    EF 60%, mild MR   . Hypertension   . Hyperlipidemia   . Diabetes mellitus without complication   . History of BPH   . Chest pain     /1/11 echocardiogram EF 60%, mild MR,  . Wenckebach second degree AV block     No Known Allergies   Assessment/Plan: 1. Hypertension - patient's BP has improved with the clonidine taper and addition of lisinopril  daily. Current BP at goal < 150/49mmHg on amlodipine ,  lisinopril , and clonidine 0.1mg  daily. BMET from today showed stable SCr and K. Will have patient continue amlodipine and lisinopril and d/c clonidine. Called daughter, Lambert Mody [phone] per patient and daughter request, and discussed lab results and medication plan. Will f/u in 1 month to reassess BP and titrate lisinopril as needed. Per Dr. Eldridge Dace, will also order EKG at this visit to see if tapering off clonidine has helped with patient's AV block.   Aniyha Tate E. Carlene Bickley, PharmD [facility] [address], [place], [place] [address] Phone: [phone]; Fax: [phone] 08/24/2014 4:32 PM

## 2014-08-28 ENCOUNTER — Other Ambulatory Visit: Payer: Self-pay | Admitting: *Deleted

## 2014-09-11 ENCOUNTER — Ambulatory Visit: Payer: Medicare HMO | Admitting: Podiatry

## 2014-09-24 ENCOUNTER — Other Ambulatory Visit: Payer: Self-pay | Admitting: Interventional Cardiology

## 2015-02-01 ENCOUNTER — Encounter (HOSPITAL_COMMUNITY): Payer: Self-pay

## 2015-02-01 ENCOUNTER — Inpatient Hospital Stay (HOSPITAL_COMMUNITY): Payer: Medicare HMO

## 2015-02-01 ENCOUNTER — Emergency Department (HOSPITAL_COMMUNITY): Payer: Medicare HMO

## 2015-02-01 ENCOUNTER — Inpatient Hospital Stay (HOSPITAL_COMMUNITY)
Admission: EM | Admit: 2015-02-01 | Discharge: 2015-02-07 | DRG: 243 | Disposition: A | Discharge status: Skilled Nursing Facility | Payer: Medicare HMO | Attending: Internal Medicine | Admitting: Internal Medicine

## 2015-02-01 DIAGNOSIS — E440 Moderate protein-calorie malnutrition: Secondary | ICD-10-CM | POA: Diagnosis present

## 2015-02-01 DIAGNOSIS — R001 Bradycardia, unspecified: Secondary | ICD-10-CM | POA: Diagnosis present

## 2015-02-01 DIAGNOSIS — N183 Chronic kidney disease, stage 3 (moderate): Secondary | ICD-10-CM | POA: Diagnosis present

## 2015-02-01 DIAGNOSIS — N189 Chronic kidney disease, unspecified: Secondary | ICD-10-CM | POA: Diagnosis not present

## 2015-02-01 DIAGNOSIS — N400 Enlarged prostate without lower urinary tract symptoms: Secondary | ICD-10-CM | POA: Diagnosis present

## 2015-02-01 DIAGNOSIS — E119 Type 2 diabetes mellitus without complications: Secondary | ICD-10-CM

## 2015-02-01 DIAGNOSIS — I4891 Unspecified atrial fibrillation: Secondary | ICD-10-CM | POA: Diagnosis present

## 2015-02-01 DIAGNOSIS — Z66 Do not resuscitate: Secondary | ICD-10-CM | POA: Diagnosis not present

## 2015-02-01 DIAGNOSIS — R296 Repeated falls: Secondary | ICD-10-CM | POA: Diagnosis present

## 2015-02-01 DIAGNOSIS — I2510 Atherosclerotic heart disease of native coronary artery without angina pectoris: Secondary | ICD-10-CM | POA: Diagnosis present

## 2015-02-01 DIAGNOSIS — Z833 Family history of diabetes mellitus: Secondary | ICD-10-CM

## 2015-02-01 DIAGNOSIS — I441 Atrioventricular block, second degree: Secondary | ICD-10-CM | POA: Diagnosis present

## 2015-02-01 DIAGNOSIS — Z8673 Personal history of transient ischemic attack (TIA), and cerebral infarction without residual deficits: Secondary | ICD-10-CM

## 2015-02-01 DIAGNOSIS — E785 Hyperlipidemia, unspecified: Secondary | ICD-10-CM | POA: Diagnosis present

## 2015-02-01 DIAGNOSIS — N179 Acute kidney failure, unspecified: Secondary | ICD-10-CM | POA: Diagnosis present

## 2015-02-01 DIAGNOSIS — G459 Transient cerebral ischemic attack, unspecified: Secondary | ICD-10-CM | POA: Diagnosis present

## 2015-02-01 DIAGNOSIS — Z794 Long term (current) use of insulin: Secondary | ICD-10-CM

## 2015-02-01 DIAGNOSIS — K5900 Constipation, unspecified: Secondary | ICD-10-CM | POA: Diagnosis not present

## 2015-02-01 DIAGNOSIS — E860 Dehydration: Secondary | ICD-10-CM | POA: Diagnosis present

## 2015-02-01 DIAGNOSIS — I129 Hypertensive chronic kidney disease with stage 1 through stage 4 chronic kidney disease, or unspecified chronic kidney disease: Secondary | ICD-10-CM | POA: Diagnosis present

## 2015-02-01 DIAGNOSIS — Z87438 Personal history of other diseases of male genital organs: Secondary | ICD-10-CM | POA: Diagnosis present

## 2015-02-01 DIAGNOSIS — R4781 Slurred speech: Secondary | ICD-10-CM

## 2015-02-01 DIAGNOSIS — Z87891 Personal history of nicotine dependence: Secondary | ICD-10-CM | POA: Diagnosis not present

## 2015-02-01 DIAGNOSIS — Z7982 Long term (current) use of aspirin: Secondary | ICD-10-CM | POA: Diagnosis not present

## 2015-02-01 DIAGNOSIS — I10 Essential (primary) hypertension: Secondary | ICD-10-CM | POA: Diagnosis present

## 2015-02-01 DIAGNOSIS — Z951 Presence of aortocoronary bypass graft: Secondary | ICD-10-CM

## 2015-02-01 DIAGNOSIS — Z6820 Body mass index (BMI) 20.0-20.9, adult: Secondary | ICD-10-CM

## 2015-02-01 DIAGNOSIS — I482 Chronic atrial fibrillation: Secondary | ICD-10-CM | POA: Diagnosis not present

## 2015-02-01 DIAGNOSIS — R531 Weakness: Secondary | ICD-10-CM

## 2015-02-01 DIAGNOSIS — I442 Atrioventricular block, complete: Secondary | ICD-10-CM | POA: Diagnosis not present

## 2015-02-01 DIAGNOSIS — Z950 Presence of cardiac pacemaker: Secondary | ICD-10-CM | POA: Insufficient documentation

## 2015-02-01 DIAGNOSIS — E1122 Type 2 diabetes mellitus with diabetic chronic kidney disease: Secondary | ICD-10-CM | POA: Diagnosis present

## 2015-02-01 HISTORY — DX: Atrioventricular block, second degree: I44.1

## 2015-02-01 LAB — TROPONIN I: TROPONIN I: 0.05 ng/mL — AB (ref <0.031)

## 2015-02-01 LAB — BASIC METABOLIC PANEL
Anion gap: 11 (ref 5–15)
BUN: 49 mg/dL — ABNORMAL HIGH (ref 6–20)
CALCIUM: 9.6 mg/dL (ref 8.9–10.3)
CO2: 24 mmol/L (ref 22–32)
CREATININE: 1.81 mg/dL — AB (ref 0.61–1.24)
Chloride: 101 mmol/L (ref 101–111)
GFR calc Af Amer: 38 mL/min — ABNORMAL LOW (ref >60)
GFR calc non Af Amer: 32 mL/min — ABNORMAL LOW (ref >60)
GLUCOSE: 178 mg/dL — AB (ref 65–99)
Potassium: 5.2 mmol/L — ABNORMAL HIGH (ref 3.5–5.1)
Sodium: 136 mmol/L (ref 135–145)

## 2015-02-01 LAB — URINALYSIS, ROUTINE W REFLEX MICROSCOPIC
BILIRUBIN URINE: NEGATIVE
Glucose, UA: 100 mg/dL — AB
HGB URINE DIPSTICK: NEGATIVE
Ketones, ur: NEGATIVE mg/dL
Leukocytes, UA: NEGATIVE
Nitrite: NEGATIVE
Protein, ur: 30 mg/dL — AB
SPECIFIC GRAVITY, URINE: 1.018 (ref 1.005–1.030)
pH: 5 (ref 5.0–8.0)

## 2015-02-01 LAB — CBC
HCT: 38.5 % — ABNORMAL LOW (ref 39.0–52.0)
HEMOGLOBIN: 13.6 g/dL (ref 13.0–17.0)
MCH: 32 pg (ref 26.0–34.0)
MCHC: 35.3 g/dL (ref 30.0–36.0)
MCV: 90.6 fL (ref 78.0–100.0)
Platelets: 141 10*3/uL — ABNORMAL LOW (ref 150–400)
RBC: 4.25 MIL/uL (ref 4.22–5.81)
RDW: 13.3 % (ref 11.5–15.5)
WBC: 6.6 10*3/uL (ref 4.0–10.5)

## 2015-02-01 LAB — URINE MICROSCOPIC-ADD ON

## 2015-02-01 LAB — GLUCOSE, CAPILLARY: GLUCOSE-CAPILLARY: 120 mg/dL — AB (ref 65–99)

## 2015-02-01 LAB — I-STAT TROPONIN, ED: Troponin i, poc: 0.04 ng/mL (ref 0.00–0.08)

## 2015-02-01 LAB — CBG MONITORING, ED: GLUCOSE-CAPILLARY: 176 mg/dL — AB (ref 65–99)

## 2015-02-01 LAB — MRSA PCR SCREENING: MRSA BY PCR: NEGATIVE

## 2015-02-01 MED ORDER — SODIUM CHLORIDE 0.9 % IV SOLN
INTRAVENOUS | Status: AC
  Administered 2015-02-01: 23:00:00 via INTRAVENOUS

## 2015-02-01 MED ORDER — INSULIN ASPART 100 UNIT/ML ~~LOC~~ SOLN
0.0000 [IU] | Freq: Three times a day (TID) | SUBCUTANEOUS | Status: DC
  Administered 2015-02-02 – 2015-02-03 (×4): 2 [IU] via SUBCUTANEOUS
  Administered 2015-02-04: 3 [IU] via SUBCUTANEOUS
  Administered 2015-02-04: 1 [IU] via SUBCUTANEOUS
  Administered 2015-02-05: 2 [IU] via SUBCUTANEOUS
  Administered 2015-02-05: 3 [IU] via SUBCUTANEOUS
  Administered 2015-02-06: 2 [IU] via SUBCUTANEOUS
  Administered 2015-02-07 (×2): 1 [IU] via SUBCUTANEOUS
  Administered 2015-02-07: 3 [IU] via SUBCUTANEOUS

## 2015-02-01 MED ORDER — ENSURE ENLIVE PO LIQD
237.0000 mL | Freq: Two times a day (BID) | ORAL | Status: DC
  Administered 2015-02-02: 237 mL via ORAL

## 2015-02-01 MED ORDER — SODIUM CHLORIDE 0.9 % IJ SOLN
3.0000 mL | Freq: Two times a day (BID) | INTRAMUSCULAR | Status: DC
  Administered 2015-02-01 – 2015-02-07 (×8): 3 mL via INTRAVENOUS

## 2015-02-01 MED ORDER — HEPARIN SODIUM (PORCINE) 5000 UNIT/ML IJ SOLN
5000.0000 [IU] | Freq: Three times a day (TID) | INTRAMUSCULAR | Status: DC
  Administered 2015-02-01 – 2015-02-04 (×10): 5000 [IU] via SUBCUTANEOUS
  Filled 2015-02-01 (×9): qty 1

## 2015-02-01 MED ORDER — ASPIRIN EC 325 MG PO TBEC
325.0000 mg | DELAYED_RELEASE_TABLET | Freq: Every day | ORAL | Status: DC
  Administered 2015-02-02 – 2015-02-07 (×6): 325 mg via ORAL
  Filled 2015-02-01 (×7): qty 1

## 2015-02-01 MED ORDER — OXYCODONE HCL 5 MG PO TABS: 5.0000 mg | ORAL_TABLET | ORAL | Status: DC | PRN

## 2015-02-01 MED ORDER — ALBUTEROL SULFATE (2.5 MG/3ML) 0.083% IN NEBU: 2.5000 mg | INHALATION_SOLUTION | RESPIRATORY_TRACT | Status: DC | PRN

## 2015-02-01 MED ORDER — ONDANSETRON HCL 4 MG PO TABS: 4.0000 mg | ORAL_TABLET | Freq: Four times a day (QID) | ORAL | Status: DC | PRN

## 2015-02-01 MED ORDER — ATROPINE SULFATE 0.4 MG/ML IJ SOLN
0.4000 mg | INTRAMUSCULAR | Status: DC | PRN
  Filled 2015-02-01: qty 1

## 2015-02-01 MED ORDER — ONDANSETRON HCL 4 MG/2ML IJ SOLN: 4.0000 mg | Freq: Four times a day (QID) | INTRAMUSCULAR | Status: DC | PRN

## 2015-02-01 MED ORDER — LORAZEPAM 1 MG PO TABS: 1.0000 mg | ORAL_TABLET | Freq: Once | ORAL | Status: DC

## 2015-02-01 MED ORDER — POLYETHYLENE GLYCOL 3350 17 G PO PACK
17.0000 g | PACK | Freq: Every day | ORAL | Status: DC
  Administered 2015-02-01 – 2015-02-02 (×2): 17 g via ORAL
  Filled 2015-02-01 (×2): qty 1

## 2015-02-01 MED ORDER — SENNA 8.6 MG PO TABS
1.0000 | ORAL_TABLET | Freq: Two times a day (BID) | ORAL | Status: DC
  Administered 2015-02-01 – 2015-02-07 (×11): 8.6 mg via ORAL
  Filled 2015-02-01 (×12): qty 1

## 2015-02-01 MED ORDER — SORBITOL 70 % SOLN: 30.0000 mL | Freq: Every day | Status: DC | PRN

## 2015-02-01 MED ORDER — LISINOPRIL 10 MG PO TABS
10.0000 mg | ORAL_TABLET | Freq: Every day | ORAL | Status: DC
  Administered 2015-02-01 – 2015-02-07 (×7): 10 mg via ORAL
  Filled 2015-02-01 (×7): qty 1

## 2015-02-01 MED ORDER — ALUM & MAG HYDROXIDE-SIMETH 200-200-20 MG/5ML PO SUSP: 30.0000 mL | Freq: Four times a day (QID) | ORAL | Status: DC | PRN

## 2015-02-01 MED ORDER — HYDRALAZINE HCL 20 MG/ML IJ SOLN
10.0000 mg | Freq: Once | INTRAMUSCULAR | Status: AC
  Administered 2015-02-01: 10 mg via INTRAVENOUS
  Filled 2015-02-01: qty 1

## 2015-02-01 MED ORDER — SIMVASTATIN 20 MG PO TABS
20.0000 mg | ORAL_TABLET | Freq: Every evening | ORAL | Status: DC
  Administered 2015-02-01 – 2015-02-06 (×6): 20 mg via ORAL
  Filled 2015-02-01 (×6): qty 1

## 2015-02-01 MED ORDER — ACETAMINOPHEN 325 MG PO TABS: 650.0000 mg | ORAL_TABLET | Freq: Four times a day (QID) | ORAL | Status: DC | PRN

## 2015-02-01 MED ORDER — ACETAMINOPHEN 650 MG RE SUPP: 650.0000 mg | Freq: Four times a day (QID) | RECTAL | Status: DC | PRN

## 2015-02-01 MED ORDER — ZOLPIDEM TARTRATE 5 MG PO TABS: 2.5000 mg | ORAL_TABLET | Freq: Every evening | ORAL | Status: DC | PRN

## 2015-02-01 MED ORDER — SODIUM CHLORIDE 0.9 % IV BOLUS (SEPSIS)
1000.0000 mL | Freq: Once | INTRAVENOUS | Status: AC
Start: 2015-02-01 — End: 2015-02-01
  Administered 2015-02-01: 1000 mL via INTRAVENOUS

## 2015-02-01 NOTE — Consult Note (Signed)
Primary cardiologist:Dr Vernon Jackson Consulting cardiologist: Dr Vernon RichJonathan Cy Bresee MD Consult requested by: Dr Ramiro Harvestaniel Thompson  Clinical Summary [name] is a 82 y.o.male male with history of CAD and prior CABG in 2011, HTN, Hyperlipidemia, DM2, and history of 2:1 AV block admitted with generalized fatigue.    From Dr Vernon HancockVaranassi's note 07/2014 it was thought that his 2:1 AV block may be contributing to his generalized fatigue at that time. His clonidine was weaned at that time with plans to follow symptoms, though it was mentioned a pacemaker may be indicated in the future. Since that time his fatigue has progressed. There was a presyncopal episode recently, but unclear if this was related to hypoglycemia as symptoms improved with orange juice,  a blood glucose was not checked at times of symptoms. At time of my interview patient's family is not available, however it appears he has had some other neurological symptoms recently including troubles with his memory as well as an episode of slurred speech and weakness to the point the patient could not feed himself.     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 Looks like 2:1 AV block No Known Allergies  Medications Scheduled Medications: . [START ON 02/02/2015] insulin aspart  0-9 Units Subcutaneous TID WC  . lisinopril  10 mg Oral Daily  . LORazepam  1 mg Oral Once  . polyethylene glycol  17 g Oral Daily     Infusions: . sodium chloride       PRN Medications:     Past Medical History  Diagnosis Date  . Coronary artery disease     Bypass graft surgeryx4 03/2009  . CAD (coronary artery disease) 03 01 2011    EF 60%, mild MR   . Hypertension   . Hyperlipidemia   . Diabetes mellitus without complication (HCC)    . History of BPH   . Chest pain     /1/11 echocardiogram EF 60%, mild MR,  . Wenckebach second degree AV block     Past Surgical History  Procedure Laterality Date  . Cardiac catheterization      CABG X 4 vessel complicated by Atrial fib and bradycardia    Family History  Problem Relation Age of Onset  . Diabetes Father   . Diabetes Brother   . Diabetes Brother   . Diabetes Son     Social History [name] reports that he has quit smoking. He does not have any smokeless tobacco history on file. [name] reports that he does not drink alcohol.  Review of Systems CONSTITUTIONAL: No weight loss, fever, chills, weakness or fatigue.  HEENT: Eyes: No visual loss, blurred vision, double vision or yellow sclerae. No hearing loss, sneezing, congestion, runny nose or sore throat.  SKIN: No rash or itching.  CARDIOVASCULAR: No chest pain, chest pressure or chest discomfort. No palpitations or edema.  RESPIRATORY: No shortness of breath, cough or sputum.  GASTROINTESTINAL: No anorexia, nausea, vomiting or diarrhea. No abdominal pain or blood.  GENITOURINARY: no polyuria, no dysuria NEUROLOGICAL: per HPI MUSCULOSKELETAL: No muscle, back pain, joint pain or stiffness.  HEMATOLOGIC: No anemia, bleeding or bruising.  LYMPHATICS: No enlarged nodes. No history of splenectomy.  PSYCHIATRIC: No history of depression or anxiety.      Physical Examination Blood pressure 191/61, pulse  47, temperature 97.9 F (36.6 C), temperature source Oral, resp. rate 13, height 5\' 6"  (1.676 m), weight 125 lb (56.7 kg), SpO2 100 %.  Intake/Output Summary (Last 24 hours) at 02/01/15 2048 Last data filed at 02/01/15 2000  Gross per 24 hour  Intake   1000 ml  Output      0 ml  Net   1000 ml    HEENT: sclera clear  Cardiovascular: regular, brady 45, no m/r/g, no jvd  Respiratory: CTAB  GI: abdomen soft, NT, ND  MSK: no focal deficits  Neuro: no focal deficits  Psych: appropriate  affect   Lab Results  Basic Metabolic Panel:  Recent Labs Lab 02/01/15 1430  NA 136  K 5.2*  CL 101  CO2 24  GLUCOSE 178*  BUN 49*  CREATININE 1.81*  CALCIUM 9.6    Liver Function Tests: No results for input(s): AST, ALT, ALKPHOS, BILITOT, PROT, ALBUMIN in the last 168 hours.  CBC:  Recent Labs Lab 02/01/15 1430  WBC 6.6  HGB 13.6  HCT 38.5*  MCV 90.6  PLT 141*    Cardiac Enzymes: No results for input(s): CKTOTAL, CKMB, CKMBINDEX, TROPONINI in the last 168 hours.  BNP: Invalid input(s): POCBNP     Impression/Recommendations 1. Bradycardia - poor baseline on EKG, looks like 2:1 AV block similar to his prior EKGs. The nonconducted Pwave combines with the T-wave making interpretation more difficult when combined with baseline artifact - At time of my history his family is not present and he is somewhat a limited historian so difficult to get clear description of his symptoms. Unclear how much his bradycardia is playing in his symptoms.Certaintly could be causing his fatigue, but his memory/cognitivie changes, episode of slurred speech/drooling, and episode of weakness where he could not feed himself would not be explained by bradycardia alone. 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  - follow heart rates off meds (he has been off his Toprol at home x 2 days).  - pending further workup and more precise history once family is available, may need consideration for pacemaker      Vernon Rich, M.D

## 2015-02-01 NOTE — ED Notes (Signed)
Patient transported to MRI 

## 2015-02-01 NOTE — ED Notes (Signed)
Attempted report 

## 2015-02-01 NOTE — H&P (Signed)
[facility] History and Physical  GUSTAVE LINDEMAN ZOX:[id number] DOB: 11-29-1927 DOA: 02/01/2015  Referring physician: Dr Dalene Seltzer PCP: Quentin Mulling, MD  Cardiologist: Dr Eldridge Dace  Chief Complaint: Weakness  HPI: Vernon Jackson is a 82 y.o. male  With history of hypertension, hyperlipidemia, coronary artery disease status post CABG 03/2009, diabetes mellitus, Wenckebach came back second-degree AV block who presents to the ED with a 5 to seven-day history of worsening generalized weakness. Most of the history is obtained from daughter-in-law and patient. 3 days prior to admission around 2 PM in the afternoon patient in conversation with his son felt he might be going into shock from his diabetes and felt very weak. He also had symptoms of presyncope at that time however did not have any noticeable syncopal episode. A fireman who stayed next door assessed the patient and gave the patient some orange juice as well as some thing to eat patient became more alert and oriented. Blood sugars taken after patient had eaten something was 335. CBG was not drawn at the time of his symptoms. Patient's son and daughter-in-law went to see the patient at that time and patient was noticeably very weak and usually uses a walker at baseline. Patient does note that his heart rate has been in the 40s. Per family patient with increasing falls. Patient has not taken his beta blocker over the past couple of days. Patient's daughter-in-law and son today patient into the home as they felt patient was too weak to take care of himself. The night prior to admission family was concerned patient may have had a TIA as patient was noted to have some slurred speech which lasted 1-2 minutes while on the phone and patient was noted to have some drooling and significant weakness to the point where he was a two-person assist. It was also noted that patient needed help to feed himself. No seizures noted. Patient endorses  constipation. Patient denies any recent fevers, no chills, no chest pain, no shortness of breath, no nausea, no vomiting, no abdominal pain, no diarrhea, no dysuria, no cough, no melena, no hematemesis, no hematochezia, no other associated symptoms. Patient was seen in the emergency room CT head which was done was negative for any acute abnormalities. Basic metabolic profile had a potassium of 5.2 BUN of 49 creatinine of 1.81 otherwise was within normal limits. Point-of-care troponin was negative. CBC was unremarkable. CBC was 178. Urinalysis was nitrite negative leukocytes negative. Chest x-ray was not done. [facility] will call to admit the patient for further evaluation and management. Patient very hesitant to be admitted.   Review of Systems: As per history of present illness otherwise negative. Constitutional:  No weight loss, night sweats, Fevers, chills, fatigue.  HEENT:  No headaches, Difficulty swallowing,Tooth/dental problems,Sore throat,  No sneezing, itching, ear ache, nasal congestion, post nasal drip,  Cardio-vascular:  No chest pain, Orthopnea, PND, swelling in lower extremities, anasarca, dizziness, palpitations  GI:  No heartburn, indigestion, abdominal pain, nausea, vomiting, diarrhea, change in bowel habits, loss of appetite  Resp:  No shortness of breath with exertion or at rest. No excess mucus, no productive cough, No non-productive cough, No coughing up of blood.No change in color of mucus.No wheezing.No chest wall deformity  Skin:  no rash or lesions.  GU:  no dysuria, change in color of urine, no urgency or frequency. No flank pain.  Musculoskeletal:  No joint pain or swelling. No decreased range of motion. No back pain.  Psych:  No change in mood or affect. No depression or anxiety. No memory loss.   Past Medical History  Diagnosis Date  . Coronary artery disease     Bypass graft surgeryx4 03/2009  . CAD (coronary artery disease) 03 01 2011    EF 60%,  mild MR   . Hypertension   . Hyperlipidemia   . Diabetes mellitus without complication (HCC)   . History of BPH   . Chest pain     /1/11 echocardiogram EF 60%, mild MR,  . Wenckebach second degree AV block    Past Surgical History  Procedure Laterality Date  . Cardiac catheterization      CABG X 4 vessel complicated by Atrial fib and bradycardia   Social History:  reports that he has quit smoking. He does not have any smokeless tobacco history on file. He reports that he does not drink alcohol or use illicit drugs.  No Known Allergies  Family History  Problem Relation Age of Onset  . Diabetes Father   . Diabetes Brother   . Diabetes Brother   . Diabetes Son    father deceased age 80 from acute stroke and also had a history of diabetes. Mother deceased at age 80 from old age.  Prior to Admission medications   Medication Sig Start Date End Date Taking? Authorizing Provider  amLODipine (NORVASC) 10 MG tablet TAKE 1 TABLET (10 MG TOTAL) BY MOUTH DAILY. 09/25/14   Corky CraftsJayadeep S Varanasi, MD  aspirin EC 325 MG tablet Take 1 tablet (325 mg total) by mouth daily. 04/10/13   Belkys A Regalado, MD  furosemide (LASIX) 40 MG tablet take 1 tablet by mouth ONLY AS NEEDED Patient not taking: Reported on 08/24/2014    Corky CraftsJayadeep S Varanasi, MD  insulin aspart protamine- aspart (NOVOLOG MIX 70/30) (70-30) 100 UNIT/ML injection Inject 20-30 Units into the skin 2 (two) times daily with a meal.     Historical Provider, MD  lisinopril (PRINIVIL,ZESTRIL) 10 MG tablet Take 1 tablet (10 mg total) by mouth daily. 08/15/14   Corky CraftsJayadeep S Varanasi, MD  simvastatin (ZOCOR) 20 MG tablet Take 20 mg by mouth every evening.    Historical Provider, MD  zolpidem (AMBIEN) 5 MG tablet Take 2.5-5 mg by mouth at bedtime as needed for sleep.    Historical Provider, MD   Physical Exam: Filed Vitals:   02/01/15 1417 02/01/15 1713 02/01/15 1800 02/01/15 1900  BP: 173/57 172/55 147/50 199/55  Pulse: 50 50 43 44  Temp: 97.9 F  (36.6 C)     TempSrc: Oral     Resp: 18 16 13 15   Height: 5\' 6"  (1.676 m)     Weight: 56.7 kg (125 lb)     SpO2: 100% 100% 100% 100%    Wt Readings from Last 3 Encounters:  02/01/15 56.7 kg (125 lb)  08/14/14 57.661 kg (127 lb 1.9 oz)  04/09/13 73.211 kg (161 lb 6.4 oz)    General:  Frail elderly gentleman laying on gurney no acute cardiopulmonary distress. Speaking in full sentences.  Eyes: PERRLA,EOMI, normal lids, irises & conjunctiva ENT: grossly normal hearing, lips & tongue Neck: no LAD, masses or thyromegaly Cardiovascular: BradycardiC, no m/r/g. No LE edema. Telemetry: Sinus bradycardia. Respiratory: CTA bilaterally, no w/r/r. Normal respiratory effort. Abdomen: soft, ntnd, positive bowel sounds, no rebound, no guarding. Skin: no rash or induration seen on limited exam Musculoskeletal: 4 out of 5 bilateral upper extremity strength.  3-4/5 bilateral lower extremity strength.  Psychiatric: grossly normal mood and  affect, speech fluent and appropriate Neurologic: Alert and oriented 3. Cranial nerves II through XII grossly intact. No focal deficits. Visual fields are intact. Sensation is intact. 2+ bilateral brachioradialis reflexes. Unable to elicit patellar or Achilles reflexes symmetrically and diffusely. Gait not tested secondary to safety.           Labs on Admission:  Basic Metabolic Panel:  Recent Labs Lab 02/01/15 1430  NA 136  K 5.2*  CL 101  CO2 24  GLUCOSE 178*  BUN 49*  CREATININE 1.81*  CALCIUM 9.6   Liver Function Tests: No results for input(s): AST, ALT, ALKPHOS, BILITOT, PROT, ALBUMIN in the last 168 hours. No results for input(s): LIPASE, AMYLASE in the last 168 hours. No results for input(s): AMMONIA in the last 168 hours. CBC:  Recent Labs Lab 02/01/15 1430  WBC 6.6  HGB 13.6  HCT 38.5*  MCV 90.6  PLT 141*   Cardiac Enzymes: No results for input(s): CKTOTAL, CKMB, CKMBINDEX, TROPONINI in the last 168 hours.  BNP (last 3  results) No results for input(s): BNP in the last 8760 hours.  ProBNP (last 3 results) No results for input(s): PROBNP in the last 8760 hours.  CBG:  Recent Labs Lab 02/01/15 1430  GLUCAP 176*    Radiological Exams on Admission: Ct Head Wo Contrast  02/01/2015  CLINICAL DATA:  Syncope yesterday. Weakness in both legs since yesterday. Fall. EXAM: CT HEAD WITHOUT CONTRAST TECHNIQUE: Contiguous axial images were obtained from the base of the skull through the vertex without intravenous contrast. COMPARISON:  04/10/2013 FINDINGS: Remote lacunar infarct along the posterior limb left internal capsule/ left posterior lentiform nucleus. Otherwise, the brainstem, cerebellum, cerebral peduncles, thalami, basal ganglia, basilar cisterns, and ventricular system appear within normal limits. Periventricular white matter and corona radiata hypodensities favor chronic ischemic microvascular white matter disease. No intracranial hemorrhage, mass lesion, or acute CVA. There is atherosclerotic calcification of the cavernous carotid arteries bilaterally. IMPRESSION: 1. No acute intracranial findings. 2. Periventricular white matter and corona radiata hypodensities favor chronic ischemic microvascular white matter disease. 3. Remote lacunar infarct along the posterior limb left internal capsule/left posterior lentiform nucleus. Electronically Signed   By: Gaylyn Rong M.D.   On: 02/01/2015 16:49    EKG: Independently reviewed. Sinus bradycardia with a junctional rhythm. Artifact noted.  Assessment/Plan Principal Problem:   Generalized weakness Active Problems:   Symptomatic bradycardia: Probable   Coronary artery disease   CAD (coronary artery disease)   Hypertension   Hyperlipidemia   Diabetes mellitus without complication (HCC)   History of BPH   Atrial fibrillation (HCC)   TIA (transient ischemic attack)   Wenckebach second degree AV block   Chronic renal insufficiency   Weakness  #1  generalized weakness likely secondary to symptomatic bradycardia Patient presented with a generalized weakness differential includes probable symptomatic bradycardia. Patient noted to have heart rate in the 40s. Patient with a history of Wenckebach second-degree AV block on metoprolol and Norvasc presenting with worsening generalized weakness and debility to the point where he is unable to take care of his ADLs. Patient was also noted to have some transient slurred speech and some drooling per family. Patient states has not taken his beta blocker in the past 48 hours. CT head negative. Check MRI of the head. Patient is hesitant to be admitted and is discussing with his family. Recommend admission to telemetry bed. Discontinue beta blocker. Cycle cardiac enzymes every 6 hours 3. Check a TSH, Magnesium. Check a 2-D echo.  Check orthostatics. Consult with cardiology for further evaluation and management and possibility of possible pacemaker placement. Follow.  #2 hypertension Stable. Will hold patient's beta blocker secondary to problem #1. Continue lisinopril.  #3 diabetes mellitus Check a hemoglobin A1c. Patient states he is on 70/30 and takes 20 units daily or twice daily based on his CBGs. Will place on a sliding scale insulin for now. Follow.  #4 history of atrial fibrillation Patient with bradycardia. Will hold patient's beta blocker. Full dose aspirin.  #5 chronic kidney disease stage III Stable at baseline.  #6 history of TIA Continue full dose aspirin.  #7 hyperlipidemia Check a fasting lipid panel. Continue statin.  #8 coronary artery disease status post CABG Stable. Patient denies any chest pain. Repeat EKG. Cycle cardiac enzymes every 6 hours 3. Check a 2-D echo. Will hold patient's  diuretics and beta blocker, spironolactone due to patient's significant generalized weakness and concern for symptomatic bradycardia. Gentle hydration.  #9 constipation Placed on MiraLAX daily.  Senokot-S at bedtime.  #10 prophylaxis Lovenox for DVT prophylaxis.  Code Status: DO NOT RESUSCITATE DVT Prophylaxis: Lovenox. Family Communication: Updated patient, son, daughter-in-law at bedside. Disposition Plan: Admit to SDU  Time spent: 50 MINS  [facility] MD [facility] Pager [phone]

## 2015-02-01 NOTE — ED Notes (Signed)
Pt unable to stand up for standing orthostatic test.

## 2015-02-01 NOTE — Progress Notes (Signed)
Tried to get patient from the weighting room for his CT scan, and unable to locate patient at 1500.

## 2015-02-01 NOTE — ED Notes (Signed)
Patient here with increased weakness, confusion and syncopal event yesterday at 4pm. Today feels ok but ongoing weakness, alert and oriented x 4. Patient thought the evenbts yesterday might be related to his diabetes

## 2015-02-02 ENCOUNTER — Inpatient Hospital Stay (HOSPITAL_COMMUNITY): Payer: Medicare HMO

## 2015-02-02 DIAGNOSIS — R001 Bradycardia, unspecified: Secondary | ICD-10-CM

## 2015-02-02 DIAGNOSIS — I441 Atrioventricular block, second degree: Principal | ICD-10-CM

## 2015-02-02 DIAGNOSIS — E119 Type 2 diabetes mellitus without complications: Secondary | ICD-10-CM

## 2015-02-02 DIAGNOSIS — I2510 Atherosclerotic heart disease of native coronary artery without angina pectoris: Secondary | ICD-10-CM

## 2015-02-02 DIAGNOSIS — R531 Weakness: Secondary | ICD-10-CM

## 2015-02-02 LAB — LIPID PANEL
Cholesterol: 141 mg/dL (ref 0–200)
HDL: 39 mg/dL — AB (ref >40)
LDL CALC: 81 mg/dL (ref 0–99)
TRIGLYCERIDES: 107 mg/dL (ref <150)
Total CHOL/HDL Ratio: 3.6 RATIO
VLDL: 21 mg/dL (ref 0–40)

## 2015-02-02 LAB — GLUCOSE, CAPILLARY
GLUCOSE-CAPILLARY: 114 mg/dL — AB (ref 65–99)
GLUCOSE-CAPILLARY: 193 mg/dL — AB (ref 65–99)
Glucose-Capillary: 171 mg/dL — ABNORMAL HIGH (ref 65–99)

## 2015-02-02 LAB — CBC
HCT: 35.7 % — ABNORMAL LOW (ref 39.0–52.0)
HEMOGLOBIN: 12.2 g/dL — AB (ref 13.0–17.0)
MCH: 31.6 pg (ref 26.0–34.0)
MCHC: 34.2 g/dL (ref 30.0–36.0)
MCV: 92.5 fL (ref 78.0–100.0)
Platelets: 122 10*3/uL — ABNORMAL LOW (ref 150–400)
RBC: 3.86 MIL/uL — AB (ref 4.22–5.81)
RDW: 13.6 % (ref 11.5–15.5)
WBC: 4.4 10*3/uL (ref 4.0–10.5)

## 2015-02-02 LAB — HEPATIC FUNCTION PANEL
ALBUMIN: 3.4 g/dL — AB (ref 3.5–5.0)
ALT: 34 U/L (ref 17–63)
AST: 58 U/L — AB (ref 15–41)
Alkaline Phosphatase: 40 U/L (ref 38–126)
BILIRUBIN TOTAL: 1 mg/dL (ref 0.3–1.2)
Bilirubin, Direct: 0.1 mg/dL (ref 0.1–0.5)
Indirect Bilirubin: 0.9 mg/dL (ref 0.3–0.9)
Total Protein: 5.9 g/dL — ABNORMAL LOW (ref 6.5–8.1)

## 2015-02-02 LAB — BASIC METABOLIC PANEL
ANION GAP: 7 (ref 5–15)
BUN: 35 mg/dL — ABNORMAL HIGH (ref 6–20)
CHLORIDE: 104 mmol/L (ref 101–111)
CO2: 25 mmol/L (ref 22–32)
Calcium: 8.8 mg/dL — ABNORMAL LOW (ref 8.9–10.3)
Creatinine, Ser: 1.47 mg/dL — ABNORMAL HIGH (ref 0.61–1.24)
GFR calc non Af Amer: 42 mL/min — ABNORMAL LOW (ref >60)
GFR, EST AFRICAN AMERICAN: 48 mL/min — AB (ref >60)
GLUCOSE: 209 mg/dL — AB (ref 65–99)
Potassium: 4.6 mmol/L (ref 3.5–5.1)
Sodium: 136 mmol/L (ref 135–145)

## 2015-02-02 LAB — PROTIME-INR
INR: 1.3 (ref 0.00–1.49)
Prothrombin Time: 16.3 seconds — ABNORMAL HIGH (ref 11.6–15.2)

## 2015-02-02 LAB — TROPONIN I
Troponin I: 0.05 ng/mL — ABNORMAL HIGH (ref <0.031)
Troponin I: 0.04 ng/mL — ABNORMAL HIGH (ref <0.031)

## 2015-02-02 LAB — MAGNESIUM: MAGNESIUM: 2.2 mg/dL (ref 1.7–2.4)

## 2015-02-02 LAB — TSH: TSH: 1.015 u[IU]/mL (ref 0.350–4.500)

## 2015-02-02 MED ORDER — SORBITOL 70 % SOLN
30.0000 mL | Freq: Once | Status: AC
  Administered 2015-02-02: 30 mL via ORAL
  Filled 2015-02-02: qty 30

## 2015-02-02 MED ORDER — GLUCERNA SHAKE PO LIQD
237.0000 mL | Freq: Two times a day (BID) | ORAL | Status: DC
  Administered 2015-02-04 – 2015-02-07 (×3): 237 mL via ORAL

## 2015-02-02 NOTE — Progress Notes (Signed)
Subjective:  No complaints  Objective:  Temp:  [97.5 F (36.4 C)-97.9 F (36.6 C)] 97.5 F (36.4 C) (01/14 0732) Pulse Rate:  [37-78] 44 (01/14 0732) Resp:  [11-18] 17 (01/14 0732) BP: (109-199)/(35-96) 155/96 mmHg (01/14 0732) SpO2:  [96 %-100 %] 100 % (01/14 0732) Weight:  [125 lb (56.7 kg)-125 lb 10.6 oz (57 kg)] 125 lb 10.6 oz (57 kg) (01/14 0322) Weight change:   Intake/Output from previous day: 01/13 0701 - 01/14 0700 In: 1530 [I.V.:1530] Out: 750 [Urine:750]  Intake/Output from this shift:    Physical Exam: General appearance: alert and no distress Neck: no adenopathy, no carotid bruit, no JVD, supple, symmetrical, trachea midline and thyroid not enlarged, symmetric, no tenderness/mass/nodules Lungs: clear to auscultation bilaterally Heart: regular rate and rhythm, S1, S2 normal, no murmur, click, rub or gallop Extremities: extremities normal, atraumatic, no cyanosis or edema  Lab Results: Results for orders placed or performed during the hospital encounter of 02/01/15 (from the past 48 hour(s))  Basic metabolic panel     Status: Abnormal   Collection Time: 02/01/15  2:30 PM  Result Value Ref Range   Sodium 136 135 - 145 mmol/L   Potassium 5.2 (H) 3.5 - 5.1 mmol/L   Chloride 101 101 - 111 mmol/L   CO2 24 22 - 32 mmol/L   Glucose, Bld 178 (H) 65 - 99 mg/dL   BUN 49 (H) 6 - 20 mg/dL   Creatinine, Ser 1.81 (H) 0.61 - 1.24 mg/dL   Calcium 9.6 8.9 - 10.3 mg/dL   GFR calc non Af Amer 32 (L) >60 mL/min   GFR calc Af Amer 38 (L) >60 mL/min    Comment: (NOTE) The eGFR has been calculated using the CKD EPI equation. This calculation has not been validated in all clinical situations. eGFR's persistently <60 mL/min signify possible Chronic Kidney Disease.    Anion gap 11 5 - 15  CBC     Status: Abnormal   Collection Time: 02/01/15  2:30 PM  Result Value Ref Range   WBC 6.6 4.0 - 10.5 K/uL   RBC 4.25 4.22 - 5.81 MIL/uL   Hemoglobin 13.6 13.0 - 17.0 g/dL   HCT 38.5 (L) 39.0 - 52.0 %   MCV 90.6 78.0 - 100.0 fL   MCH 32.0 26.0 - 34.0 pg   MCHC 35.3 30.0 - 36.0 g/dL   RDW 13.3 11.5 - 15.5 %   Platelets 141 (L) 150 - 400 K/uL  CBG monitoring, ED     Status: Abnormal   Collection Time: 02/01/15  2:30 PM  Result Value Ref Range   Glucose-Capillary 176 (H) 65 - 99 mg/dL  Urinalysis, Routine w reflex microscopic (not at [facility])     Status: Abnormal   Collection Time: 02/01/15  6:20 PM  Result Value Ref Range   Color, Urine YELLOW YELLOW   APPearance CLEAR CLEAR   Specific Gravity, Urine 1.018 1.005 - 1.030   pH 5.0 5.0 - 8.0   Glucose, UA 100 (A) NEGATIVE mg/dL   Hgb urine dipstick NEGATIVE NEGATIVE   Bilirubin Urine NEGATIVE NEGATIVE   Ketones, ur NEGATIVE NEGATIVE mg/dL   Protein, ur 30 (A) NEGATIVE mg/dL   Nitrite NEGATIVE NEGATIVE   Leukocytes, UA NEGATIVE NEGATIVE  Urine microscopic-add on     Status: Abnormal   Collection Time: 02/01/15  6:20 PM  Result Value Ref Range   Squamous Epithelial / LPF 0-5 (A) NONE SEEN   WBC, UA 0-5 0 - 5  WBC/hpf   RBC / HPF 0-5 0 - 5 RBC/hpf   Bacteria, UA RARE (A) NONE SEEN   Casts HYALINE CASTS (A) NEGATIVE  I-Stat Troponin, ED (not at [facility])     Status: None   Collection Time: 02/01/15  6:49 PM  Result Value Ref Range   Troponin i, poc 0.04 0.00 - 0.08 ng/mL   Comment 3            Comment: Due to the release kinetics of cTnI, a negative result within the first hours of the onset of symptoms does not rule out myocardial infarction with certainty. If myocardial infarction is still suspected, repeat the test at appropriate intervals.   MRSA PCR Screening     Status: None   Collection Time: 02/01/15  9:29 PM  Result Value Ref Range   MRSA by PCR NEGATIVE NEGATIVE    Comment:        The GeneXpert MRSA Assay (FDA approved for NASAL specimens only), is one component of a comprehensive MRSA colonization surveillance program. It is not intended to diagnose MRSA infection nor to guide or monitor  treatment for MRSA infections.   Glucose, capillary     Status: Abnormal   Collection Time: 02/01/15  9:30 PM  Result Value Ref Range   Glucose-Capillary 120 (H) 65 - 99 mg/dL   Comment 1 Capillary Specimen   Troponin I (q 6hr x 3)     Status: Abnormal   Collection Time: 02/01/15 10:04 PM  Result Value Ref Range   Troponin I 0.05 (H) <0.031 ng/mL    Comment:        PERSISTENTLY INCREASED TROPONIN VALUES IN THE RANGE OF 0.04-0.49 ng/mL CAN BE SEEN IN:       -UNSTABLE ANGINA       -CONGESTIVE HEART FAILURE       -MYOCARDITIS       -CHEST TRAUMA       -ARRYHTHMIAS       -LATE PRESENTING MYOCARDIAL INFARCTION       -COPD   CLINICAL FOLLOW-UP RECOMMENDED.   TSH     Status: None   Collection Time: 02/01/15 10:04 PM  Result Value Ref Range   TSH 1.015 0.350 - 4.500 uIU/mL  Troponin I (q 6hr x 3)     Status: Abnormal   Collection Time: 02/02/15  1:48 AM  Result Value Ref Range   Troponin I 0.05 (H) <0.031 ng/mL    Comment:        PERSISTENTLY INCREASED TROPONIN VALUES IN THE RANGE OF 0.04-0.49 ng/mL CAN BE SEEN IN:       -UNSTABLE ANGINA       -CONGESTIVE HEART FAILURE       -MYOCARDITIS       -CHEST TRAUMA       -ARRYHTHMIAS       -LATE PRESENTING MYOCARDIAL INFARCTION       -COPD   CLINICAL FOLLOW-UP RECOMMENDED.   Magnesium     Status: None   Collection Time: 02/02/15  1:48 AM  Result Value Ref Range   Magnesium 2.2 1.7 - 2.4 mg/dL  Hepatic function panel     Status: Abnormal   Collection Time: 02/02/15  1:48 AM  Result Value Ref Range   Total Protein 5.9 (L) 6.5 - 8.1 g/dL   Albumin 3.4 (L) 3.5 - 5.0 g/dL   AST 58 (H) 15 - 41 U/L   ALT 34 17 - 63 U/L   Alkaline Phosphatase 40 38 -  126 U/L   Total Bilirubin 1.0 0.3 - 1.2 mg/dL   Bilirubin, Direct 0.1 0.1 - 0.5 mg/dL   Indirect Bilirubin 0.9 0.3 - 0.9 mg/dL  Lipid panel     Status: Abnormal   Collection Time: 02/02/15  2:30 AM  Result Value Ref Range   Cholesterol 141 0 - 200 mg/dL   Triglycerides 107 <150  mg/dL   HDL 39 (L) >40 mg/dL   Total CHOL/HDL Ratio 3.6 RATIO   VLDL 21 0 - 40 mg/dL   LDL Cholesterol 81 0 - 99 mg/dL    Comment:        Total Cholesterol/HDL:CHD Risk Coronary Heart Disease Risk Table                     Men   Women  1/2 Average Risk   3.4   3.3  Average Risk       5.0   4.4  2 X Average Risk   9.6   7.1  3 X Average Risk  23.4   11.0        Use the calculated Patient Ratio above and the CHD Risk Table to determine the patient's CHD Risk.        ATP III CLASSIFICATION (LDL):  <100     mg/dL   Optimal  100-129  mg/dL   Near or Above                    Optimal  130-159  mg/dL   Borderline  160-189  mg/dL   High  >190     mg/dL   Very High   Glucose, capillary     Status: Abnormal   Collection Time: 02/02/15  7:34 AM  Result Value Ref Range   Glucose-Capillary 114 (H) 65 - 99 mg/dL   Comment 1 Capillary Specimen     Imaging: Imaging results have been reviewed  TeleLoletha Grayer in 40s with 2:1  Assessment/Plan:   1. Principal Problem: 2.   Generalized weakness 3. Active Problems: 4.   Coronary artery disease 5.   CAD (coronary artery disease) 6.   Hypertension 7.   Hyperlipidemia 8.   Diabetes mellitus without complication ([place]) 9.   History of BPH 10.   Atrial fibrillation ([place]) 11.   TIA (transient ischemic attack) 12.   Wenckebach second degree AV block 13.   Chronic renal insufficiency 14.   Weakness 15.   Symptomatic bradycardia: Probable 16.   Weakness generalized 17.   Constipation 18.   Time Spent Directly with Patient:  20 minutes  Length of Stay:  LOS: 1 day   Pt of Varanasi's with known CAD , H/O remote CABG, admitted with weakness. Not on any neg chronotropes. Chronic brady with what appears to be 2:1 AVB. May benefit from PTVPM. I have discussed this with patient and he is willing to entertain. I have asked Dr Lovena Le to see (Tomorrow). Exam benign.   Quay Burow 02/02/2015, 8:50 AM

## 2015-02-02 NOTE — Progress Notes (Signed)
[facility] PROGRESS NOTE  DREYDON CARDENAS Jackson:[id number] DOB: [birth year] DOA: 02/01/2015 PCP: Quentin Mulling, MD  Assessment/Plan: #1 probable symptomatic bradycardia/Wenckeback second degree AVB Patient presenting with worsening fatigue and worsening generalized weakness to the point where he's become a two-person assist at home and per family was requiring help with feeding and patient with increased falls. Patient with history of coronary artery disease status post CABG, chronic bradycardia and Wenckeback second-degree AV block. Patient appears to be in the 2-1 AV block. Heart rate noted as low as 27 on the telemetry monitor. Heart rate in the high 20s to 30s while sleeping and in the 40s to 50s while awake. Patient has been off his beta blocker for the past 48 hours. Cardiac enzymes were slightly elevated troponins at 0.05 however seems to be plateaued. 2-D echo pending. Magnesium 2.2. Basic metabolic profile pending. TSH within normal limits at 1.015. Cardiology following and EP to assess to see patient may benefit from a pacemaker. Appreciate cardiology input and recommendations.   #2 generalized weakness Likely secondary to problem #1. CT had MRI head negative for any acute abnormalities. TSH within normal limits. PT/OT.   #3 diabetes mellitus Hemoglobin A1c pending. CBGs have ranged from 114 -176. Oral hypoglycemic agents on hold. Sliding scale insulin.  #4 hypertension Continue ACE inhibitor.  #5 history atrial fibrillation Patient with bradycardia. Beta blocker on hold. Continue aspirin.  #6 chronic kidney disease stage III Stable on admission. Labs pending. Follow.  #7 history of TIA Continue aspirin for secondary stroke prevention.  #8 hyperlipidemia Fasting lipid panel with LDL of 81. Continue statin.  #9 constipation Continue MiraLAX and Senokot-S at bedtime. We'll give a dose of sorbitol. If no bowel movement will give a soapsuds enema.  #10  prophylaxis Heparin for DVT prophylaxis.  Code Status: DO NOT RESUSCITATE Family Communication: Updated patient. No family at bedside. Disposition Plan: Remain in the step down unit.   Consultants:  Cardiology: Dr. Wyline Mood 02/01/2015  Procedures:  CT head 02/01/2015  MRI head 02/01/2015  Chest x-ray 02/01/2015  Antibiotics:  None  HPI/Subjective: Patient sitting up in chair eating breakfast. Patient denies any shortness of breath. No chest pain. No significant change in weakness. Patient noted overnight to have heart rates ranging from the upper 20s to 130s while sleeping and in the 40s while awake.  Objective: Filed Vitals:   02/02/15 0600 02/02/15 0732  BP: 118/37 155/96  Pulse: 78 44  Temp:  97.5 F (36.4 C)  Resp: 12 17    Intake/Output Summary (Last 24 hours) at 02/02/15 0912 Last data filed at 02/02/15 0600  Gross per 24 hour  Intake   1530 ml  Output    750 ml  Net    780 ml   Filed Weights   02/01/15 1417 02/01/15 2306 02/02/15 0322  Weight: 56.7 kg (125 lb) 57 kg (125 lb 10.6 oz) 57 kg (125 lb 10.6 oz)    Exam:   General:  NAD  Cardiovascular: Bradycardia  Respiratory: CTAB  Abdomen: Soft, nontender, nondistended, positive bowel sounds.  Musculoskeletal: No clubbing cyanosis or edema.  Data Reviewed: Basic Metabolic Panel:  Recent Labs Lab 02/01/15 1430 02/02/15 0148  NA 136  --   K 5.2*  --   CL 101  --   CO2 24  --   GLUCOSE 178*  --   BUN 49*  --   CREATININE 1.81*  --   CALCIUM 9.6  --   MG  --  2.2  Liver Function Tests:  Recent Labs Lab 02/02/15 0148  AST 58*  ALT 34  ALKPHOS 40  BILITOT 1.0  PROT 5.9*  ALBUMIN 3.4*   No results for input(s): LIPASE, AMYLASE in the last 168 hours. No results for input(s): AMMONIA in the last 168 hours. CBC:  Recent Labs Lab 02/01/15 1430  WBC 6.6  HGB 13.6  HCT 38.5*  MCV 90.6  PLT 141*   Cardiac Enzymes:  Recent Labs Lab 02/01/15 2204 02/02/15 0148  TROPONINI  0.05* 0.05*   BNP (last 3 results) No results for input(s): BNP in the last 8760 hours.  ProBNP (last 3 results) No results for input(s): PROBNP in the last 8760 hours.  CBG:  Recent Labs Lab 02/01/15 1430 02/01/15 2130 02/02/15 0734  GLUCAP 176* 120* 114*    Recent Results (from the past 240 hour(s))  MRSA PCR Screening     Status: None   Collection Time: 02/01/15  9:29 PM  Result Value Ref Range Status   MRSA by PCR NEGATIVE NEGATIVE Final    Comment:        The GeneXpert MRSA Assay (FDA approved for NASAL specimens only), is one component of a comprehensive MRSA colonization surveillance program. It is not intended to diagnose MRSA infection nor to guide or monitor treatment for MRSA infections.      Studies: Ct Head Wo Contrast  02/01/2015  CLINICAL DATA:  Syncope yesterday. Weakness in both legs since yesterday. Fall. EXAM: CT HEAD WITHOUT CONTRAST TECHNIQUE: Contiguous axial images were obtained from the base of the skull through the vertex without intravenous contrast. COMPARISON:  04/10/2013 FINDINGS: Remote lacunar infarct along the posterior limb left internal capsule/ left posterior lentiform nucleus. Otherwise, the brainstem, cerebellum, cerebral peduncles, thalami, basal ganglia, basilar cisterns, and ventricular system appear within normal limits. Periventricular white matter and corona radiata hypodensities favor chronic ischemic microvascular white matter disease. No intracranial hemorrhage, mass lesion, or acute CVA. There is atherosclerotic calcification of the cavernous carotid arteries bilaterally. IMPRESSION: 1. No acute intracranial findings. 2. Periventricular white matter and corona radiata hypodensities favor chronic ischemic microvascular white matter disease. 3. Remote lacunar infarct along the posterior limb left internal capsule/left posterior lentiform nucleus. Electronically Signed   By: Gaylyn Rong M.D.   On: 02/01/2015 16:49   Mr Brain  Wo Contrast  02/01/2015  CLINICAL DATA:  One week of worsening generalized weakness, pre syncopal episode this afternoon, possibly related to heart block. History of heart block, hypertension, hyperlipidemia, diabetes. EXAM: MRI HEAD WITHOUT CONTRAST TECHNIQUE: Multiplanar, multiecho pulse sequences of the brain and surrounding structures were obtained without intravenous contrast. COMPARISON:  CT head February 01, 2015 at 1634 hours FINDINGS: The ventricles and sulci are normal for patient's age. No abnormal parenchymal signal, mass lesions, mass effect. No reduced diffusion to suggest acute ischemia. No susceptibility artifact to suggest hemorrhage. Old bilateral basal ganglia lacunar infarct with mild gliosis. Patchy supratentorial white matter T2 hyperintensities are less than expected for age. No abnormal extra-axial fluid collections. Mildly prominent posterior fossa greater than supratentorial extra-axial spaces are nonspecific. No extra-axial masses though, contrast enhanced sequences would be more sensitive. Normal major intracranial vascular flow voids seen at the skull base. Ocular globes and orbital contents are nonsuspicious though not tailored for evaluation. Status post bilateral ocular lens implants. No abnormal sellar expansion. No suspicious calvarial bone marrow signal. Craniocervical junction maintained. Visualized paranasal sinus are well aerated. Trace RIGHT greater LEFT mastoid effusion. Patient is edentulous. IMPRESSION: No acute intracranial process.  Involutional changes. Mild chronic small vessel ischemic disease. Old bilateral basal ganglia lacunar infarcts. Electronically Signed   By: Awilda Metroourtnay  Bloomer M.D.   On: 02/01/2015 20:57   Portable Chest 1 View  02/02/2015  CLINICAL DATA:  82 year old male with weakness. EXAM: PORTABLE CHEST 1 VIEW COMPARISON:  Radiograph dated 05/21/2009 FINDINGS: Single-view of the chest does not demonstrate a focal consolidation. There is no pleural  effusion or pneumothorax. Stable cardiac silhouette. Median sternotomy wires. Degenerative changes of the shoulders. IMPRESSION: No active disease. Electronically Signed   By: Elgie CollardArash  Radparvar M.D.   On: 02/02/2015 01:50    Scheduled Meds: . aspirin EC  325 mg Oral Daily  . feeding supplement (ENSURE ENLIVE)  237 mL Oral BID BM  . heparin  5,000 Units Subcutaneous 3 times per day  . insulin aspart  0-9 Units Subcutaneous TID WC  . lisinopril  10 mg Oral Daily  . LORazepam  1 mg Oral Once  . polyethylene glycol  17 g Oral Daily  . senna  1 tablet Oral BID  . simvastatin  20 mg Oral QPM  . sodium chloride  3 mL Intravenous Q12H   Continuous Infusions: . sodium chloride 75 mL/hr at 02/01/15 2256    Principal Problem:   Symptomatic bradycardia: Probable Active Problems:   Wenckebach second degree AV block   Generalized weakness   Coronary artery disease   CAD (coronary artery disease)   Hypertension   Hyperlipidemia   Diabetes mellitus without complication (HCC)   History of BPH   Atrial fibrillation (HCC)   TIA (transient ischemic attack)   Chronic renal insufficiency   Weakness   Weakness generalized   Constipation    Time spent: 40 mins    Saint Joseph EastHOMPSON,DANIEL MD [facility] Pager [phone]. If 7PM-7AM, please contact night-coverage at [email or web address], password [facility] [email or web address] 02/02/2015, 9:12 AM  LOS: 1 day

## 2015-02-02 NOTE — Progress Notes (Signed)
Patient's daughter stated that prior PCP wanted the patient tested for Parkinson's disease due to shaking hands and shuffling gait, however the patient refused to see a neurologist. Patient's daughter is concerned the decline in function and increasing weakness is due to this.

## 2015-02-02 NOTE — ED Provider Notes (Signed)
CSN: [id number]     Arrival date & time 02/01/15  1408 History   First MD Initiated Contact with Patient 02/01/15 1613     Chief Complaint  Patient presents with  . Weakness     (Consider location/radiation/quality/duration/timing/severity/associated sxs/prior Treatment) Patient is a 83 y.o. male presenting with weakness.  Weakness Associated symptoms include weakness. Pertinent negatives include no abdominal pain, chest pain, chills, coughing, fever, headaches, nausea, rash or vomiting.    Patient has had a few days of increased weakness.  Last night pt was speaking on the phone when he was noted to have slurred speech for a couple of minutes.  He has additionally had a couple of falls recently.  No chest pain, headache, or other sx.  Family is concerned he might be having TIA's.  He is having increased difficulty with ambulation over the past couple of days and the family is having difficulty caring for him at home so they have brought him to the ED   Past Medical History  Diagnosis Date  . Coronary artery disease     Bypass graft surgeryx4 03/2009  . CAD (coronary artery disease) 03 01 2011    EF 60%, mild MR   . Hypertension   . Hyperlipidemia   . Diabetes mellitus without complication (HCC)   . History of BPH   . Chest pain     /1/11 echocardiogram EF 60%, mild MR,  . Wenckebach second degree AV block    Past Surgical History  Procedure Laterality Date  . Cardiac catheterization      CABG X 4 vessel complicated by Atrial fib and bradycardia   Family History  Problem Relation Age of Onset  . Diabetes Father   . Diabetes Brother   . Diabetes Brother   . Diabetes Son    Social History  Substance Use Topics  . Smoking status: Former Games developer  . Smokeless tobacco: None  . Alcohol Use: No    Review of Systems  Constitutional: Negative for fever and chills.  Eyes: Negative for redness.  Respiratory: Negative for cough and shortness of breath.   Cardiovascular:  Negative for chest pain.  Gastrointestinal: Negative for nausea, vomiting, abdominal pain and diarrhea.  Genitourinary: Negative for dysuria.  Skin: Negative for rash.  Neurological: Positive for weakness. Negative for headaches.  All other systems reviewed and are negative.     Allergies  Review of patient's allergies indicates no known allergies.  Home Medications   Prior to Admission medications   Medication Sig Start Date End Date Taking? Authorizing Provider  amLODipine (NORVASC) 10 MG tablet TAKE 1 TABLET (10 MG TOTAL) BY MOUTH DAILY. 09/25/14  Yes Corky Crafts, MD  insulin aspart protamine- aspart (NOVOLOG MIX 70/30) (70-30) 100 UNIT/ML injection Inject 20-30 Units into the skin 2 (two) times daily with a meal.    Yes Historical Provider, MD  lisinopril (PRINIVIL,ZESTRIL) 10 MG tablet Take 1 tablet (10 mg total) by mouth daily. 08/15/14  Yes Corky Crafts, MD  metoprolol succinate (TOPROL-XL) 25 MG 24 hr tablet Take 25 mg by mouth daily. 12/02/14  Yes Historical Provider, MD  simvastatin (ZOCOR) 20 MG tablet Take 20 mg by mouth every evening.   Yes Historical Provider, MD  spironolactone (ALDACTONE) 50 MG tablet Take 50 mg by mouth daily. 12/02/14  Yes Historical Provider, MD  zolpidem (AMBIEN) 5 MG tablet Take 2.5-5 mg by mouth at bedtime as needed for sleep.   Yes Historical Provider, MD  aspirin EC 325  MG tablet Take 1 tablet (325 mg total) by mouth daily. 04/10/13   Belkys A Regalado, MD   BP 158/46 mmHg  Pulse 44  Temp(Src) 97.8 F (36.6 C) (Oral)  Resp 15  Ht 5\' 5"  (1.651 m)  Wt 57 kg  BMI 20.91 kg/m2  SpO2 99% Physical Exam  Constitutional: No distress.  HENT:  Head: Normocephalic and atraumatic.  Eyes: EOM are normal. Pupils are equal, round, and reactive to light.  Neck: Normal range of motion. Neck supple.  Cardiovascular: Normal rate.   Pulmonary/Chest: Effort normal. No respiratory distress.  Abdominal: Soft. There is no tenderness.   Musculoskeletal: Normal range of motion.  Neurological: He is alert. He has normal strength. No cranial nerve deficit or sensory deficit. Coordination and gait abnormal.  Pt with difficulty with ambulation.  Patient needs two person assistance to get to seated position.  Once patient is helped to standing he has a shuffling gait and requires assistance.  Skin: No rash noted. He is not diaphoretic.  Psychiatric: He has a normal mood and affect.    ED Course  Procedures (including critical care time) Labs Review Labs Reviewed  BASIC METABOLIC PANEL - Abnormal; Notable for the following:    Potassium 5.2 (*)    Glucose, Bld 178 (*)    BUN 49 (*)    Creatinine, Ser 1.81 (*)    GFR calc non Af Amer 32 (*)    GFR calc Af Amer 38 (*)    All other components within normal limits  CBC - Abnormal; Notable for the following:    HCT 38.5 (*)    Platelets 141 (*)    All other components within normal limits  URINALYSIS, ROUTINE W REFLEX MICROSCOPIC (NOT AT [facility]) - Abnormal; Notable for the following:    Glucose, UA 100 (*)    Protein, ur 30 (*)    All other components within normal limits  URINE MICROSCOPIC-ADD ON - Abnormal; Notable for the following:    Squamous Epithelial / LPF 0-5 (*)    Bacteria, UA RARE (*)    Casts HYALINE CASTS (*)    All other components within normal limits  TROPONIN I - Abnormal; Notable for the following:    Troponin I 0.05 (*)    All other components within normal limits  TROPONIN I - Abnormal; Notable for the following:    Troponin I 0.05 (*)    All other components within normal limits  LIPID PANEL - Abnormal; Notable for the following:    HDL 39 (*)    All other components within normal limits  HEPATIC FUNCTION PANEL - Abnormal; Notable for the following:    Total Protein 5.9 (*)    Albumin 3.4 (*)    AST 58 (*)    All other components within normal limits  CBC - Abnormal; Notable for the following:    RBC 3.86 (*)    Hemoglobin 12.2 (*)    HCT  35.7 (*)    Platelets 122 (*)    All other components within normal limits  PROTIME-INR - Abnormal; Notable for the following:    Prothrombin Time 16.3 (*)    All other components within normal limits  GLUCOSE, CAPILLARY - Abnormal; Notable for the following:    Glucose-Capillary 120 (*)    All other components within normal limits  GLUCOSE, CAPILLARY - Abnormal; Notable for the following:    Glucose-Capillary 114 (*)    All other components within normal limits  GLUCOSE, CAPILLARY -  Abnormal; Notable for the following:    Glucose-Capillary 193 (*)    All other components within normal limits  CBG MONITORING, ED - Abnormal; Notable for the following:    Glucose-Capillary 176 (*)    All other components within normal limits  URINE CULTURE  MRSA PCR SCREENING  TSH  MAGNESIUM  TROPONIN I  HEMOGLOBIN A1C  BASIC METABOLIC PANEL  I-STAT TROPOININ, ED    Imaging Review Ct Head Wo Contrast  02/01/2015  CLINICAL DATA:  Syncope yesterday. Weakness in both legs since yesterday. Fall. EXAM: CT HEAD WITHOUT CONTRAST TECHNIQUE: Contiguous axial images were obtained from the base of the skull through the vertex without intravenous contrast. COMPARISON:  04/10/2013 FINDINGS: Remote lacunar infarct along the posterior limb left internal capsule/ left posterior lentiform nucleus. Otherwise, the brainstem, cerebellum, cerebral peduncles, thalami, basal ganglia, basilar cisterns, and ventricular system appear within normal limits. Periventricular white matter and corona radiata hypodensities favor chronic ischemic microvascular white matter disease. No intracranial hemorrhage, mass lesion, or acute CVA. There is atherosclerotic calcification of the cavernous carotid arteries bilaterally. IMPRESSION: 1. No acute intracranial findings. 2. Periventricular white matter and corona radiata hypodensities favor chronic ischemic microvascular white matter disease. 3. Remote lacunar infarct along the posterior limb  left internal capsule/left posterior lentiform nucleus. Electronically Signed   By: Gaylyn Rong M.D.   On: 02/01/2015 16:49   Mr Brain Wo Contrast  02/01/2015  CLINICAL DATA:  One week of worsening generalized weakness, pre syncopal episode this afternoon, possibly related to heart block. History of heart block, hypertension, hyperlipidemia, diabetes. EXAM: MRI HEAD WITHOUT CONTRAST TECHNIQUE: Multiplanar, multiecho pulse sequences of the brain and surrounding structures were obtained without intravenous contrast. COMPARISON:  CT head February 01, 2015 at 1634 hours FINDINGS: The ventricles and sulci are normal for patient's age. No abnormal parenchymal signal, mass lesions, mass effect. No reduced diffusion to suggest acute ischemia. No susceptibility artifact to suggest hemorrhage. Old bilateral basal ganglia lacunar infarct with mild gliosis. Patchy supratentorial white matter T2 hyperintensities are less than expected for age. No abnormal extra-axial fluid collections. Mildly prominent posterior fossa greater than supratentorial extra-axial spaces are nonspecific. No extra-axial masses though, contrast enhanced sequences would be more sensitive. Normal major intracranial vascular flow voids seen at the skull base. Ocular globes and orbital contents are nonsuspicious though not tailored for evaluation. Status post bilateral ocular lens implants. No abnormal sellar expansion. No suspicious calvarial bone marrow signal. Craniocervical junction maintained. Visualized paranasal sinus are well aerated. Trace RIGHT greater LEFT mastoid effusion. Patient is edentulous. IMPRESSION: No acute intracranial process. Involutional changes. Mild chronic small vessel ischemic disease. Old bilateral basal ganglia lacunar infarcts. Electronically Signed   By: Awilda Metro M.D.   On: 02/01/2015 20:57   Portable Chest 1 View  02/02/2015  CLINICAL DATA:  82 year old male with weakness. EXAM: PORTABLE CHEST 1 VIEW  COMPARISON:  Radiograph dated 05/21/2009 FINDINGS: Single-view of the chest does not demonstrate a focal consolidation. There is no pleural effusion or pneumothorax. Stable cardiac silhouette. Median sternotomy wires. Degenerative changes of the shoulders. IMPRESSION: No active disease. Electronically Signed   By: Elgie Collard M.D.   On: 02/02/2015 01:50   I have personally reviewed and evaluated these images and lab results as part of my medical decision-making.   EKG Interpretation   Date/Time:  Friday February 01 2015 14:10:43 EST Ventricular Rate:  47 PR Interval:    QRS Duration: 68 QT Interval:  466 QTC Calculation: 412 R Axis:  23 Text Interpretation:  Junctional rhythm ST \\T \ T wave abnormality,  consider anterolateral ischemia Abnormal ECG No significant change since  last tracing Artifact Confirmed by Gateways Hospital And Mental Health CenterCHLOSSMAN MD, ERIN ([id number]) on  02/01/2015 4:31:19 PM      MDM   Final diagnoses:  Slurred speech  Weakness   Patient 82 y/o male who has had a few days of increased weakness associated with multiple falls.  Exam as above.  Neuro exam is non-focal at this time.    Ddx:  Doubt cva given no focal deficits.  Concern for dehydration, metabolic abnormality.  Doubt infection given no fever, no infectious sx.  On exam, HR noted to be in the 40's, this could be symptomatic bradycardia.    Will obtain ct head to eval for ICH.  Will obtain labs including cbc/bmp to eval for metabolic causes of weakness.  Will obtain ekg given some bradycardia on exam though he has a good bp.  Will obtain trop  Labs with mild aki, ct head with old cva but nothing acute.  ekg with bradycardia and what looks like first degree av block.  Will admit for dehydration with aki, weakness possibly 2/2 symptomatic bradycardia.  patient stable in the ED    Silas FloodErik Bless Lisenby, MD 02/02/15 1202  Silas FloodErik Almedia Cordell, MD 02/02/15 1203  Alvira MondayErin Schlossman, MD 02/04/15 [phone]

## 2015-02-02 NOTE — Progress Notes (Signed)
  Echocardiogram 2D Echocardiogram has been performed.  Vernon Jackson, Vernon Jackson 02/02/2015, 6:03 PM

## 2015-02-02 NOTE — Progress Notes (Signed)
Initial Nutrition Assessment  DOCUMENTATION CODES:   Non-severe (moderate) malnutrition in context of chronic illness  INTERVENTION:  Discontinue Ensure.   Provide Glucerna Shake po BID, each supplement provides 220 kcal and 10 grams of protein.  Encourage adequate PO intake.   NUTRITION DIAGNOSIS:   Malnutrition related to chronic illness as evidenced by severe depletion of body fat, moderate depletions of muscle mass.  GOAL:   Patient will meet greater than or equal to 90% of their needs  MONITOR:   PO intake, Supplement acceptance, Weight trends, Labs, I & O's, Skin  REASON FOR ASSESSMENT:   Malnutrition Screening Tool    ASSESSMENT:   With history of hypertension, hyperlipidemia, coronary artery disease status post CABG 03/2009, diabetes mellitus, Wenckebach came back second-degree AV block who presents to the ED with a 5 to seven-day history of worsening generalized weakness.  Pt reports having a good appetite currently and PTA with consumption of at least 3 meals a day. Current meal completion has been 100%. Usual body weight reported to be ~152 lbs. Per Epic weight records, however, pt with no significant weight loss within recent time frame. Pt currently has Ensure ordered and has been consuming them. Noted CBG's have been elevated. RD to order Glucerna Shake instead.   Nutrition-Focused physical exam completed. Findings are severe fat depletion, moderate muscle depletion, and no edema.   CBG's 114-209 mg/dL.  Diet Order:  Diet Heart Room service appropriate?: Yes; Fluid consistency:: Thin  Skin:  Wound (see comment) (Skin tear on shoulder and elbow)  Last BM:  Unknown  Height:   Ht Readings from Last 1 Encounters:  02/01/15 5\' 5"  (1.651 m)    Weight:   Wt Readings from Last 1 Encounters:  02/02/15 125 lb 10.6 oz (57 kg)    Ideal Body Weight:  61.8 kg  BMI:  Body mass index is 20.91 kg/(m^2).  Estimated Nutritional Needs:   Kcal:   1650-1850  Protein:  75-85 grams  Fluid:  1.6- 1.8 L/day  EDUCATION NEEDS:   No education needs identified at this time  Roslyn SmilingStephanie Uchechi [place], MS, RD, LDN Pager # [phone] After hours/ weekend pager # [phone]

## 2015-02-03 DIAGNOSIS — N183 Chronic kidney disease, stage 3 (moderate): Secondary | ICD-10-CM

## 2015-02-03 DIAGNOSIS — E440 Moderate protein-calorie malnutrition: Secondary | ICD-10-CM | POA: Insufficient documentation

## 2015-02-03 DIAGNOSIS — I10 Essential (primary) hypertension: Secondary | ICD-10-CM

## 2015-02-03 LAB — GLUCOSE, CAPILLARY
GLUCOSE-CAPILLARY: 109 mg/dL — AB (ref 65–99)
Glucose-Capillary: 154 mg/dL — ABNORMAL HIGH (ref 65–99)
Glucose-Capillary: 155 mg/dL — ABNORMAL HIGH (ref 65–99)
Glucose-Capillary: 255 mg/dL — ABNORMAL HIGH (ref 65–99)

## 2015-02-03 LAB — BASIC METABOLIC PANEL
ANION GAP: 9 (ref 5–15)
BUN: 33 mg/dL — ABNORMAL HIGH (ref 6–20)
CO2: 22 mmol/L (ref 22–32)
Calcium: 8.9 mg/dL (ref 8.9–10.3)
Chloride: 105 mmol/L (ref 101–111)
Creatinine, Ser: 1.52 mg/dL — ABNORMAL HIGH (ref 0.61–1.24)
GFR calc non Af Amer: 40 mL/min — ABNORMAL LOW (ref >60)
GFR, EST AFRICAN AMERICAN: 46 mL/min — AB (ref >60)
GLUCOSE: 165 mg/dL — AB (ref 65–99)
POTASSIUM: 4.6 mmol/L (ref 3.5–5.1)
Sodium: 136 mmol/L (ref 135–145)

## 2015-02-03 LAB — URINE CULTURE: Culture: NO GROWTH

## 2015-02-03 LAB — CBC
HEMATOCRIT: 33.6 % — AB (ref 39.0–52.0)
HEMOGLOBIN: 11.6 g/dL — AB (ref 13.0–17.0)
MCH: 31.7 pg (ref 26.0–34.0)
MCHC: 34.5 g/dL (ref 30.0–36.0)
MCV: 91.8 fL (ref 78.0–100.0)
Platelets: 120 10*3/uL — ABNORMAL LOW (ref 150–400)
RBC: 3.66 MIL/uL — AB (ref 4.22–5.81)
RDW: 13.4 % (ref 11.5–15.5)
WBC: 5.2 10*3/uL (ref 4.0–10.5)

## 2015-02-03 MED ORDER — POLYETHYLENE GLYCOL 3350 17 G PO PACK
17.0000 g | PACK | Freq: Two times a day (BID) | ORAL | Status: DC
  Administered 2015-02-03 – 2015-02-07 (×6): 17 g via ORAL
  Filled 2015-02-03 (×8): qty 1

## 2015-02-03 NOTE — Consult Note (Addendum)
Reason for Consult:2:1 heart block  Referring Physician: Dr. Erasmo DownerBerry  Vernon Jackson is an 82 y.o. male.   HPI: The patient is an 82 yo man with multiple medical problems who was admitted with generalized weakness. The patient has had documented bradycardia in the past with ECG's dating back 2 years ago demonstrating HR of 40. His ECG several months ago demonstrated a HR of 38/min. With 2:1 AV block. He has had trouble with his blood sugar and does not a syncopal episode several days ago. He has been off of all AV nodal blocking drugs.  He denies chest pain or sob or nausea or vomiting.  PMH: Past Medical History  Diagnosis Date  . Coronary artery disease     Bypass graft surgeryx4 03/2009  . CAD (coronary artery disease) 03 01 2011    EF 60%, mild MR   . Hypertension   . Hyperlipidemia   . Diabetes mellitus without complication (HCC)   . History of BPH   . Chest pain     /1/11 echocardiogram EF 60%, mild MR,  . Wenckebach second degree AV block     PSHX: Past Surgical History  Procedure Laterality Date  . Cardiac catheterization      CABG X 4 vessel complicated by Atrial fib and bradycardia    FAMHX: Family History  Problem Relation Age of Onset  . Diabetes Father   . Diabetes Brother   . Diabetes Brother   . Diabetes Son     Social History:  reports that he has quit smoking. He does not have any smokeless tobacco history on file. He reports that he does not drink alcohol or use illicit drugs.  Allergies: No Known Allergies  Medications: I have reviewed the patient's current medications.  Ct Head Wo Contrast  02/01/2015  CLINICAL DATA:  Syncope yesterday. Weakness in both legs since yesterday. Fall. EXAM: CT HEAD WITHOUT CONTRAST TECHNIQUE: Contiguous axial images were obtained from the base of the skull through the vertex without intravenous contrast. COMPARISON:  04/10/2013 FINDINGS: Remote lacunar infarct along the posterior limb left internal capsule/ left  posterior lentiform nucleus. Otherwise, the brainstem, cerebellum, cerebral peduncles, thalami, basal ganglia, basilar cisterns, and ventricular system appear within normal limits. Periventricular white matter and corona radiata hypodensities favor chronic ischemic microvascular white matter disease. No intracranial hemorrhage, mass lesion, or acute CVA. There is atherosclerotic calcification of the cavernous carotid arteries bilaterally. IMPRESSION: 1. No acute intracranial findings. 2. Periventricular white matter and corona radiata hypodensities favor chronic ischemic microvascular white matter disease. 3. Remote lacunar infarct along the posterior limb left internal capsule/left posterior lentiform nucleus. Electronically Signed   By: Gaylyn RongWalter  Liebkemann M.D.   On: 02/01/2015 16:49   Mr Brain Wo Contrast  02/01/2015  CLINICAL DATA:  One week of worsening generalized weakness, pre syncopal episode this afternoon, possibly related to heart block. History of heart block, hypertension, hyperlipidemia, diabetes. EXAM: MRI HEAD WITHOUT CONTRAST TECHNIQUE: Multiplanar, multiecho pulse sequences of the brain and surrounding structures were obtained without intravenous contrast. COMPARISON:  CT head February 01, 2015 at 1634 hours FINDINGS: The ventricles and sulci are normal for patient's age. No abnormal parenchymal signal, mass lesions, mass effect. No reduced diffusion to suggest acute ischemia. No susceptibility artifact to suggest hemorrhage. Old bilateral basal ganglia lacunar infarct with mild gliosis. Patchy supratentorial white matter T2 hyperintensities are less than expected for age. No abnormal extra-axial fluid collections. Mildly prominent posterior fossa greater than supratentorial extra-axial spaces are nonspecific.  No extra-axial masses though, contrast enhanced sequences would be more sensitive. Normal major intracranial vascular flow voids seen at the skull base. Ocular globes and orbital contents  are nonsuspicious though not tailored for evaluation. Status post bilateral ocular lens implants. No abnormal sellar expansion. No suspicious calvarial bone marrow signal. Craniocervical junction maintained. Visualized paranasal sinus are well aerated. Trace RIGHT greater LEFT mastoid effusion. Patient is edentulous. IMPRESSION: No acute intracranial process. Involutional changes. Mild chronic small vessel ischemic disease. Old bilateral basal ganglia lacunar infarcts. Electronically Signed   By: Awilda Metro M.D.   On: 02/01/2015 20:57   Portable Chest 1 View  02/02/2015  CLINICAL DATA:  82 year old male with weakness. EXAM: PORTABLE CHEST 1 VIEW COMPARISON:  Radiograph dated 05/21/2009 FINDINGS: Single-view of the chest does not demonstrate a focal consolidation. There is no pleural effusion or pneumothorax. Stable cardiac silhouette. Median sternotomy wires. Degenerative changes of the shoulders. IMPRESSION: No active disease. Electronically Signed   By: Elgie Collard M.D.   On: 02/02/2015 01:50    ROS  As stated in the HPI and negative for all other systems.  Physical Exam  Vitals:Blood pressure 167/55, pulse 41, temperature 97.6 F (36.4 C), temperature source Oral, resp. rate 14, height 5\' 5"  (1.651 m), weight 126 lb 12.2 oz (57.5 kg), SpO2 98 %.  diskempt appearing elderly man NAD HEENT: Unremarkable Neck:  7 cm JVD, no thyromegally Lymphatics:  No adenopathy Back:  No CVA tenderness Lungs:  Clear with no wheezes HEART:  IRegular brady rhythm, no murmurs, no rubs, no clicks Abd:  soft, positive bowel sounds, no organomegally, no rebound, no guarding Ext:  2 plus pulses, no edema, no cyanosis, no clubbing Skin:  No rashes no nodules Neuro:  CN II through XII intact, motor grossly intact  Tele - nsr with 2:1 AV block along with lesser AV block  ECG - NSR with 2;1AV block  Assessment/Plan: 1. Generalized fatigue and weakness 2. 2:1 AV block, longstanding 3. DM Rec:  while his AV block may contributing to his symptoms, he had a HR of 38/min in the office back in the summer. I suspect his symptoms of weakness are multifactorial and I am concerned he will be just as weak after a PPM. I will discuss with Dr. Eldridge Dace tomorrow. I spoke and shared my concerns with the patient and Dr. Janee Morn. We will make a decision about PPM tomorrow.  Sharlot Gowda TaylorMD 02/03/2015, 8:56 AM

## 2015-02-03 NOTE — Progress Notes (Signed)
[facility] PROGRESS NOTE  Vernon Jackson ZOX:[id number] DOB: [birth year] DOA: 02/01/2015 PCP: Quentin Mulling, MD  Assessment/Plan: #1 probable symptomatic bradycardia/Wenckeback second degree AVB Patient presenting with worsening fatigue and worsening generalized weakness to the point where he's become a two-person assist at home and per family was requiring help with feeding and patient with increased falls. Patient with history of coronary artery disease status post CABG, chronic bradycardia and Wenckeback second-degree AV block. Patient appears to be in the 2-1 AV block. Heart rate noted as low as 27 on the telemetry monitor. Heart rate in the high 20s to 30s while sleeping and in the 40s to 50s while awake. Patient has been off his beta blocker for the past 72 hours. Cardiac enzymes were slightly elevated troponins at 0.05 however seems to be plateaued. 2-D echo with EF 60-65%, NWMA.  Magnesium 2.2. Potassium at 4.6. TSH within normal limits at 1.015. Cardiology following and EP ff.  #2 generalized weakness Likely secondary to problem #1. CT had MRI head negative for any acute abnormalities. TSH within normal limits. PT/OT.   #3 diabetes mellitus Hemoglobin A1c pending. CBGs have ranged from 154 -171. Oral hypoglycemic agents on hold. Sliding scale insulin.  #4 hypertension Continue ACE inhibitor.  #5 history atrial fibrillation Patient with bradycardia. Beta blocker on hold. Continue aspirin.  #6 chronic kidney disease stage III Stable on admission. Follow.  #7 history of TIA Continue aspirin for secondary stroke prevention.  #8 hyperlipidemia Fasting lipid panel with LDL of 81. Continue statin.  #9 constipation Patient with no documented BM. Continue MiraLAX and Senokot-S at bedtime. Will give soap suds enema.  #10 prophylaxis Heparin for DVT prophylaxis.  Code Status: DO NOT RESUSCITATE Family Communication: Updated patient. No family at bedside. Disposition  Plan: Remain in the step down unit.   Consultants:  Cardiology: Dr. Wyline Mood 02/01/2015  EP: Dr Ladona Ridgel 02/02/2015  Procedures:  CT head 02/01/2015  MRI head 02/01/2015  Chest x-ray 02/01/2015  2 d echo 02/02/2015  Antibiotics:  None  HPI/Subjective: Patient sitting up in chair eating breakfast. Patient denies any shortness of breath. No chest pain. No change in generalized weakness. HR 20-30s while sleeping. HR in 40s when awake.  Objective: Filed Vitals:   02/03/15 0500 02/03/15 0745  BP: 145/41 167/55  Pulse:    Temp:  97.6 F (36.4 C)  Resp: 12 14    Intake/Output Summary (Last 24 hours) at 02/03/15 0913 Last data filed at 02/03/15 0700  Gross per 24 hour  Intake    770 ml  Output    800 ml  Net    -30 ml   Filed Weights   02/01/15 2306 02/02/15 0322 02/03/15 0500  Weight: 57 kg (125 lb 10.6 oz) 57 kg (125 lb 10.6 oz) 57.5 kg (126 lb 12.2 oz)    Exam:   General:  NAD  Cardiovascular: Bradycardia  Respiratory: CTAB  Abdomen: Soft, nontender, nondistended, positive bowel sounds.  Musculoskeletal: No clubbing cyanosis or edema.  Data Reviewed: Basic Metabolic Panel:  Recent Labs Lab 02/01/15 1430 02/02/15 0148 02/02/15 1010 02/03/15 0245  NA 136  --  136 136  K 5.2*  --  4.6 4.6  CL 101  --  104 105  CO2 24  --  25 22  GLUCOSE 178*  --  209* 165*  BUN 49*  --  35* 33*  CREATININE 1.81*  --  1.47* 1.52*  CALCIUM 9.6  --  8.8* 8.9  MG  --  2.2  --   --    Liver Function Tests:  Recent Labs Lab 02/02/15 0148  AST 58*  ALT 34  ALKPHOS 40  BILITOT 1.0  PROT 5.9*  ALBUMIN 3.4*   No results for input(s): LIPASE, AMYLASE in the last 168 hours. No results for input(s): AMMONIA in the last 168 hours. CBC:  Recent Labs Lab 02/01/15 1430 02/02/15 1010 02/03/15 0245  WBC 6.6 4.4 5.2  HGB 13.6 12.2* 11.6*  HCT 38.5* 35.7* 33.6*  MCV 90.6 92.5 91.8  PLT 141* 122* 120*   Cardiac Enzymes:  Recent Labs Lab 02/01/15 2204  02/02/15 0148 02/02/15 1010  TROPONINI 0.05* 0.05* 0.04*   BNP (last 3 results) No results for input(s): BNP in the last 8760 hours.  ProBNP (last 3 results) No results for input(s): PROBNP in the last 8760 hours.  CBG:  Recent Labs Lab 02/01/15 2130 02/02/15 0734 02/02/15 1140 02/02/15 1807 02/03/15 0744  GLUCAP 120* 114* 193* 171* 154*    Recent Results (from the past 240 hour(s))  Culture, Urine     Status: None (Preliminary result)   Collection Time: 02/01/15  7:23 PM  Result Value Ref Range Status   Specimen Description URINE, RANDOM  Final   Special Requests NONE  Final   Culture NO GROWTH < 24 HOURS  Final   Report Status PENDING  Incomplete  MRSA PCR Screening     Status: None   Collection Time: 02/01/15  9:29 PM  Result Value Ref Range Status   MRSA by PCR NEGATIVE NEGATIVE Final    Comment:        The GeneXpert MRSA Assay (FDA approved for NASAL specimens only), is one component of a comprehensive MRSA colonization surveillance program. It is not intended to diagnose MRSA infection nor to guide or monitor treatment for MRSA infections.      Studies: Ct Head Wo Contrast  02/01/2015  CLINICAL DATA:  Syncope yesterday. Weakness in both legs since yesterday. Fall. EXAM: CT HEAD WITHOUT CONTRAST TECHNIQUE: Contiguous axial images were obtained from the base of the skull through the vertex without intravenous contrast. COMPARISON:  04/10/2013 FINDINGS: Remote lacunar infarct along the posterior limb left internal capsule/ left posterior lentiform nucleus. Otherwise, the brainstem, cerebellum, cerebral peduncles, thalami, basal ganglia, basilar cisterns, and ventricular system appear within normal limits. Periventricular white matter and corona radiata hypodensities favor chronic ischemic microvascular white matter disease. No intracranial hemorrhage, mass lesion, or acute CVA. There is atherosclerotic calcification of the cavernous carotid arteries bilaterally.  IMPRESSION: 1. No acute intracranial findings. 2. Periventricular white matter and corona radiata hypodensities favor chronic ischemic microvascular white matter disease. 3. Remote lacunar infarct along the posterior limb left internal capsule/left posterior lentiform nucleus. Electronically Signed   By: Gaylyn Rong M.D.   On: 02/01/2015 16:49   Mr Brain Wo Contrast  02/01/2015  CLINICAL DATA:  One week of worsening generalized weakness, pre syncopal episode this afternoon, possibly related to heart block. History of heart block, hypertension, hyperlipidemia, diabetes. EXAM: MRI HEAD WITHOUT CONTRAST TECHNIQUE: Multiplanar, multiecho pulse sequences of the brain and surrounding structures were obtained without intravenous contrast. COMPARISON:  CT head February 01, 2015 at 1634 hours FINDINGS: The ventricles and sulci are normal for patient's age. No abnormal parenchymal signal, mass lesions, mass effect. No reduced diffusion to suggest acute ischemia. No susceptibility artifact to suggest hemorrhage. Old bilateral basal ganglia lacunar infarct with mild gliosis. Patchy supratentorial white matter T2 hyperintensities are less than expected for age.  No abnormal extra-axial fluid collections. Mildly prominent posterior fossa greater than supratentorial extra-axial spaces are nonspecific. No extra-axial masses though, contrast enhanced sequences would be more sensitive. Normal major intracranial vascular flow voids seen at the skull base. Ocular globes and orbital contents are nonsuspicious though not tailored for evaluation. Status post bilateral ocular lens implants. No abnormal sellar expansion. No suspicious calvarial bone marrow signal. Craniocervical junction maintained. Visualized paranasal sinus are well aerated. Trace RIGHT greater LEFT mastoid effusion. Patient is edentulous. IMPRESSION: No acute intracranial process. Involutional changes. Mild chronic small vessel ischemic disease. Old bilateral  basal ganglia lacunar infarcts. Electronically Signed   By: Awilda Metroourtnay  Bloomer M.D.   On: 02/01/2015 20:57   Portable Chest 1 View  02/02/2015  CLINICAL DATA:  82 year old male with weakness. EXAM: PORTABLE CHEST 1 VIEW COMPARISON:  Radiograph dated 05/21/2009 FINDINGS: Single-view of the chest does not demonstrate a focal consolidation. There is no pleural effusion or pneumothorax. Stable cardiac silhouette. Median sternotomy wires. Degenerative changes of the shoulders. IMPRESSION: No active disease. Electronically Signed   By: Elgie CollardArash  Radparvar M.D.   On: 02/02/2015 01:50    Scheduled Meds: . aspirin EC  325 mg Oral Daily  . feeding supplement (GLUCERNA SHAKE)  237 mL Oral BID BM  . heparin  5,000 Units Subcutaneous 3 times per day  . insulin aspart  0-9 Units Subcutaneous TID WC  . lisinopril  10 mg Oral Daily  . LORazepam  1 mg Oral Once  . polyethylene glycol  17 g Oral Daily  . senna  1 tablet Oral BID  . simvastatin  20 mg Oral QPM  . sodium chloride  3 mL Intravenous Q12H   Continuous Infusions:    Principal Problem:   Symptomatic bradycardia: Probable Active Problems:   Wenckebach second degree AV block   Generalized weakness   Coronary artery disease   CAD (coronary artery disease)   Hypertension   Hyperlipidemia   Diabetes mellitus without complication (HCC)   History of BPH   Atrial fibrillation (HCC)   TIA (transient ischemic attack)   Chronic renal insufficiency   Weakness   Weakness generalized   Constipation   Malnutrition of moderate degree    Time spent: 40 mins    [facility] Rivers Health Medical CenterHOMPSON,Aylinn Rydberg MD [facility] Pager [phone]. If 7PM-7AM, please contact night-coverage at [email or web address], password [email or web address] 02/03/2015, 9:13 AM  LOS: 2 days

## 2015-02-03 NOTE — Progress Notes (Signed)
Subjective:  No CP/SOB, only complaint is generalized weakness, HR 20-30s last night, typically in 40s while awake  Objective:  Temp:  [97.5 F (36.4 C)-97.8 F (36.6 C)] 97.6 F (36.4 C) (01/15 0745) Pulse Rate:  [41-55] 41 (01/15 0332) Resp:  [10-19] 14 (01/15 0745) BP: (95-171)/(34-131) 167/55 mmHg (01/15 0745) SpO2:  [98 %-100 %] 98 % (01/15 0745) Weight:  [126 lb 12.2 oz (57.5 kg)] 126 lb 12.2 oz (57.5 kg) (01/15 0500) Weight change: 1 lb 12.2 oz (0.8 kg)  Intake/Output from previous day: 01/14 0701 - 01/15 0700 In: 1040 [P.O.:590; I.V.:450] Out: 800 [Urine:800]  Intake/Output from this shift:    Physical Exam: General appearance: alert and no distress Neck: no adenopathy, no carotid bruit, no JVD, supple, symmetrical, trachea midline and thyroid not enlarged, symmetric, no tenderness/mass/nodules Lungs: clear to auscultation bilaterally Heart: brady Extremities: extremities normal, atraumatic, no cyanosis or edema  Lab Results: Results for orders placed or performed during the hospital encounter of 02/01/15 (from the past 48 hour(s))  Basic metabolic panel     Status: Abnormal   Collection Time: 02/01/15  2:30 PM  Result Value Ref Range   Sodium 136 135 - 145 mmol/L   Potassium 5.2 (H) 3.5 - 5.1 mmol/L   Chloride 101 101 - 111 mmol/L   CO2 24 22 - 32 mmol/L   Glucose, Bld 178 (H) 65 - 99 mg/dL   BUN 49 (H) 6 - 20 mg/dL   Creatinine, Ser 1.81 (H) 0.61 - 1.24 mg/dL   Calcium 9.6 8.9 - 10.3 mg/dL   GFR calc non Af Amer 32 (L) >60 mL/min   GFR calc Af Amer 38 (L) >60 mL/min    Comment: (NOTE) The eGFR has been calculated using the CKD EPI equation. This calculation has not been validated in all clinical situations. eGFR's persistently <60 mL/min signify possible Chronic Kidney Disease.    Anion gap 11 5 - 15  CBC     Status: Abnormal   Collection Time: 02/01/15  2:30 PM  Result Value Ref Range   WBC 6.6 4.0 - 10.5 K/uL   RBC 4.25 4.22 - 5.81 MIL/uL     Hemoglobin 13.6 13.0 - 17.0 g/dL   HCT 38.5 (L) 39.0 - 52.0 %   MCV 90.6 78.0 - 100.0 fL   MCH 32.0 26.0 - 34.0 pg   MCHC 35.3 30.0 - 36.0 g/dL   RDW 13.3 11.5 - 15.5 %   Platelets 141 (L) 150 - 400 K/uL  CBG monitoring, ED     Status: Abnormal   Collection Time: 02/01/15  2:30 PM  Result Value Ref Range   Glucose-Capillary 176 (H) 65 - 99 mg/dL  Urinalysis, Routine w reflex microscopic (not at [facility])     Status: Abnormal   Collection Time: 02/01/15  6:20 PM  Result Value Ref Range   Color, Urine YELLOW YELLOW   APPearance CLEAR CLEAR   Specific Gravity, Urine 1.018 1.005 - 1.030   pH 5.0 5.0 - 8.0   Glucose, UA 100 (A) NEGATIVE mg/dL   Hgb urine dipstick NEGATIVE NEGATIVE   Bilirubin Urine NEGATIVE NEGATIVE   Ketones, ur NEGATIVE NEGATIVE mg/dL   Protein, ur 30 (A) NEGATIVE mg/dL   Nitrite NEGATIVE NEGATIVE   Leukocytes, UA NEGATIVE NEGATIVE  Urine microscopic-add on     Status: Abnormal   Collection Time: 02/01/15  6:20 PM  Result Value Ref Range   Squamous Epithelial / LPF 0-5 (A) NONE SEEN  WBC, UA 0-5 0 - 5 WBC/hpf   RBC / HPF 0-5 0 - 5 RBC/hpf   Bacteria, UA RARE (A) NONE SEEN   Casts HYALINE CASTS (A) NEGATIVE  I-Stat Troponin, ED (not at [facility])     Status: None   Collection Time: 02/01/15  6:49 PM  Result Value Ref Range   Troponin i, poc 0.04 0.00 - 0.08 ng/mL   Comment 3            Comment: Due to the release kinetics of cTnI, a negative result within the first hours of the onset of symptoms does not rule out myocardial infarction with certainty. If myocardial infarction is still suspected, repeat the test at appropriate intervals.   Culture, Urine     Status: None (Preliminary result)   Collection Time: 02/01/15  7:23 PM  Result Value Ref Range   Specimen Description URINE, RANDOM    Special Requests NONE    Culture NO GROWTH < 24 HOURS    Report Status PENDING   MRSA PCR Screening     Status: None   Collection Time: 02/01/15  9:29 PM  Result Value  Ref Range   MRSA by PCR NEGATIVE NEGATIVE    Comment:        The GeneXpert MRSA Assay (FDA approved for NASAL specimens only), is one component of a comprehensive MRSA colonization surveillance program. It is not intended to diagnose MRSA infection nor to guide or monitor treatment for MRSA infections.   Glucose, capillary     Status: Abnormal   Collection Time: 02/01/15  9:30 PM  Result Value Ref Range   Glucose-Capillary 120 (H) 65 - 99 mg/dL   Comment 1 Capillary Specimen   Troponin I (q 6hr x 3)     Status: Abnormal   Collection Time: 02/01/15 10:04 PM  Result Value Ref Range   Troponin I 0.05 (H) <0.031 ng/mL    Comment:        PERSISTENTLY INCREASED TROPONIN VALUES IN THE RANGE OF 0.04-0.49 ng/mL CAN BE SEEN IN:       -UNSTABLE ANGINA       -CONGESTIVE HEART FAILURE       -MYOCARDITIS       -CHEST TRAUMA       -ARRYHTHMIAS       -LATE PRESENTING MYOCARDIAL INFARCTION       -COPD   CLINICAL FOLLOW-UP RECOMMENDED.   TSH     Status: None   Collection Time: 02/01/15 10:04 PM  Result Value Ref Range   TSH 1.015 0.350 - 4.500 uIU/mL  Troponin I (q 6hr x 3)     Status: Abnormal   Collection Time: 02/02/15  1:48 AM  Result Value Ref Range   Troponin I 0.05 (H) <0.031 ng/mL    Comment:        PERSISTENTLY INCREASED TROPONIN VALUES IN THE RANGE OF 0.04-0.49 ng/mL CAN BE SEEN IN:       -UNSTABLE ANGINA       -CONGESTIVE HEART FAILURE       -MYOCARDITIS       -CHEST TRAUMA       -ARRYHTHMIAS       -LATE PRESENTING MYOCARDIAL INFARCTION       -COPD   CLINICAL FOLLOW-UP RECOMMENDED.   Magnesium     Status: None   Collection Time: 02/02/15  1:48 AM  Result Value Ref Range   Magnesium 2.2 1.7 - 2.4 mg/dL  Hepatic function panel  Status: Abnormal   Collection Time: 02/02/15  1:48 AM  Result Value Ref Range   Total Protein 5.9 (L) 6.5 - 8.1 g/dL   Albumin 3.4 (L) 3.5 - 5.0 g/dL   AST 58 (H) 15 - 41 U/L   ALT 34 17 - 63 U/L   Alkaline Phosphatase 40 38 - 126  U/L   Total Bilirubin 1.0 0.3 - 1.2 mg/dL   Bilirubin, Direct 0.1 0.1 - 0.5 mg/dL   Indirect Bilirubin 0.9 0.3 - 0.9 mg/dL  Lipid panel     Status: Abnormal   Collection Time: 02/02/15  2:30 AM  Result Value Ref Range   Cholesterol 141 0 - 200 mg/dL   Triglycerides 107 <150 mg/dL   HDL 39 (L) >40 mg/dL   Total CHOL/HDL Ratio 3.6 RATIO   VLDL 21 0 - 40 mg/dL   LDL Cholesterol 81 0 - 99 mg/dL    Comment:        Total Cholesterol/HDL:CHD Risk Coronary Heart Disease Risk Table                     Men   Women  1/2 Average Risk   3.4   3.3  Average Risk       5.0   4.4  2 X Average Risk   9.6   7.1  3 X Average Risk  23.4   11.0        Use the calculated Patient Ratio above and the CHD Risk Table to determine the patient's CHD Risk.        ATP III CLASSIFICATION (LDL):  <100     mg/dL   Optimal  100-129  mg/dL   Near or Above                    Optimal  130-159  mg/dL   Borderline  160-189  mg/dL   High  >190     mg/dL   Very High   Glucose, capillary     Status: Abnormal   Collection Time: 02/02/15  7:34 AM  Result Value Ref Range   Glucose-Capillary 114 (H) 65 - 99 mg/dL   Comment 1 Capillary Specimen   Troponin I (q 6hr x 3)     Status: Abnormal   Collection Time: 02/02/15 10:10 AM  Result Value Ref Range   Troponin I 0.04 (H) <0.031 ng/mL    Comment:        PERSISTENTLY INCREASED TROPONIN VALUES IN THE RANGE OF 0.04-0.49 ng/mL CAN BE SEEN IN:       -UNSTABLE ANGINA       -CONGESTIVE HEART FAILURE       -MYOCARDITIS       -CHEST TRAUMA       -ARRYHTHMIAS       -LATE PRESENTING MYOCARDIAL INFARCTION       -COPD   CLINICAL FOLLOW-UP RECOMMENDED.   Basic metabolic panel     Status: Abnormal   Collection Time: 02/02/15 10:10 AM  Result Value Ref Range   Sodium 136 135 - 145 mmol/L   Potassium 4.6 3.5 - 5.1 mmol/L   Chloride 104 101 - 111 mmol/L   CO2 25 22 - 32 mmol/L   Glucose, Bld 209 (H) 65 - 99 mg/dL   BUN 35 (H) 6 - 20 mg/dL   Creatinine, Ser 1.47 (H)  0.61 - 1.24 mg/dL   Calcium 8.8 (L) 8.9 - 10.3 mg/dL   GFR calc non Af Wyvonnia Lora  42 (L) >60 mL/min   GFR calc Af Amer 48 (L) >60 mL/min    Comment: (NOTE) The eGFR has been calculated using the CKD EPI equation. This calculation has not been validated in all clinical situations. eGFR's persistently <60 mL/min signify possible Chronic Kidney Disease.    Anion gap 7 5 - 15  CBC     Status: Abnormal   Collection Time: 02/02/15 10:10 AM  Result Value Ref Range   WBC 4.4 4.0 - 10.5 K/uL   RBC 3.86 (L) 4.22 - 5.81 MIL/uL   Hemoglobin 12.2 (L) 13.0 - 17.0 g/dL   HCT 35.7 (L) 39.0 - 52.0 %   MCV 92.5 78.0 - 100.0 fL   MCH 31.6 26.0 - 34.0 pg   MCHC 34.2 30.0 - 36.0 g/dL   RDW 13.6 11.5 - 15.5 %   Platelets 122 (L) 150 - 400 K/uL  Protime-INR     Status: Abnormal   Collection Time: 02/02/15 10:10 AM  Result Value Ref Range   Prothrombin Time 16.3 (H) 11.6 - 15.2 seconds   INR 1.30 0.00 - 1.49  Glucose, capillary     Status: Abnormal   Collection Time: 02/02/15 11:40 AM  Result Value Ref Range   Glucose-Capillary 193 (H) 65 - 99 mg/dL   Comment 1 Capillary Specimen   Glucose, capillary     Status: Abnormal   Collection Time: 02/02/15  6:07 PM  Result Value Ref Range   Glucose-Capillary 171 (H) 65 - 99 mg/dL   Comment 1 Capillary Specimen   Basic metabolic panel     Status: Abnormal   Collection Time: 02/03/15  2:45 AM  Result Value Ref Range   Sodium 136 135 - 145 mmol/L   Potassium 4.6 3.5 - 5.1 mmol/L   Chloride 105 101 - 111 mmol/L   CO2 22 22 - 32 mmol/L   Glucose, Bld 165 (H) 65 - 99 mg/dL   BUN 33 (H) 6 - 20 mg/dL   Creatinine, Ser 1.52 (H) 0.61 - 1.24 mg/dL   Calcium 8.9 8.9 - 10.3 mg/dL   GFR calc non Af Amer 40 (L) >60 mL/min   GFR calc Af Amer 46 (L) >60 mL/min    Comment: (NOTE) The eGFR has been calculated using the CKD EPI equation. This calculation has not been validated in all clinical situations. eGFR's persistently <60 mL/min signify possible Chronic  Kidney Disease.    Anion gap 9 5 - 15  CBC     Status: Abnormal   Collection Time: 02/03/15  2:45 AM  Result Value Ref Range   WBC 5.2 4.0 - 10.5 K/uL   RBC 3.66 (L) 4.22 - 5.81 MIL/uL   Hemoglobin 11.6 (L) 13.0 - 17.0 g/dL   HCT 33.6 (L) 39.0 - 52.0 %   MCV 91.8 78.0 - 100.0 fL   MCH 31.7 26.0 - 34.0 pg   MCHC 34.5 30.0 - 36.0 g/dL   RDW 13.4 11.5 - 15.5 %   Platelets 120 (L) 150 - 400 K/uL    Imaging: Imaging results have been reviewed  TeleLoletha Grayer with 2:1  Assessment/Plan:   1. Principal Problem: 2.   Symptomatic bradycardia: Probable 3. Active Problems: 4.   Coronary artery disease 5.   CAD (coronary artery disease) 6.   Hypertension 7.   Hyperlipidemia 8.   Diabetes mellitus without complication ([place]) 9.   History of BPH 10.   Atrial fibrillation ([place]) 11.   TIA (transient ischemic attack) 12.  Wenckebach second degree AV block 13.   Chronic renal insufficiency 14.   Weakness 15.   Generalized weakness 16.   Weakness generalized 17.   Constipation 18.   Malnutrition of moderate degree 19.   Time Spent Directly with Patient:  20 minutes  Length of Stay:  LOS: 2 days   Pt remains in 2:1 AVB with HR in 20-30s while asleep, 40s while awake. BP OK. Off neg chronotropes. Dr. Lovena Le to discuss potential PTVPM. Pt agreeable.Quay Burow 02/03/2015, 7:50 AM

## 2015-02-04 ENCOUNTER — Encounter (HOSPITAL_COMMUNITY)
Admission: EM | Disposition: A | Discharge status: Skilled Nursing Facility | Payer: Self-pay | Source: Home / Self Care | Attending: Internal Medicine

## 2015-02-04 DIAGNOSIS — I442 Atrioventricular block, complete: Secondary | ICD-10-CM

## 2015-02-04 DIAGNOSIS — N189 Chronic kidney disease, unspecified: Secondary | ICD-10-CM

## 2015-02-04 HISTORY — PX: EP IMPLANTABLE DEVICE: SHX172B

## 2015-02-04 LAB — BASIC METABOLIC PANEL
Anion gap: 7 (ref 5–15)
BUN: 34 mg/dL — AB (ref 6–20)
CALCIUM: 8.8 mg/dL — AB (ref 8.9–10.3)
CO2: 23 mmol/L (ref 22–32)
CREATININE: 1.52 mg/dL — AB (ref 0.61–1.24)
Chloride: 105 mmol/L (ref 101–111)
GFR calc Af Amer: 46 mL/min — ABNORMAL LOW (ref >60)
GFR, EST NON AFRICAN AMERICAN: 40 mL/min — AB (ref >60)
GLUCOSE: 172 mg/dL — AB (ref 65–99)
Potassium: 4.6 mmol/L (ref 3.5–5.1)
SODIUM: 135 mmol/L (ref 135–145)

## 2015-02-04 LAB — GLUCOSE, CAPILLARY
GLUCOSE-CAPILLARY: 132 mg/dL — AB (ref 65–99)
GLUCOSE-CAPILLARY: 131 mg/dL — AB (ref 65–99)
Glucose-Capillary: 148 mg/dL — ABNORMAL HIGH (ref 65–99)
Glucose-Capillary: 234 mg/dL — ABNORMAL HIGH (ref 65–99)

## 2015-02-04 LAB — MAGNESIUM: Magnesium: 2.2 mg/dL (ref 1.7–2.4)

## 2015-02-04 LAB — HEMOGLOBIN A1C
HEMOGLOBIN A1C: 7.1 % — AB (ref 4.8–5.6)
Mean Plasma Glucose: 157 mg/dL

## 2015-02-04 SURGERY — PACEMAKER IMPLANT

## 2015-02-04 MED ORDER — CHLORHEXIDINE GLUCONATE 4 % EX LIQD
60.0000 mL | Freq: Once | CUTANEOUS | Status: DC
  Filled 2015-02-04: qty 60

## 2015-02-04 MED ORDER — CEFAZOLIN SODIUM 1-5 GM-% IV SOLN
1.0000 g | Freq: Four times a day (QID) | INTRAVENOUS | Status: AC
  Administered 2015-02-04 – 2015-02-05 (×3): 1 g via INTRAVENOUS
  Filled 2015-02-04 (×3): qty 50

## 2015-02-04 MED ORDER — ONDANSETRON HCL 4 MG/2ML IJ SOLN: 4.0000 mg | Freq: Four times a day (QID) | INTRAMUSCULAR | Status: DC | PRN

## 2015-02-04 MED ORDER — CEFAZOLIN SODIUM-DEXTROSE 2-3 GM-% IV SOLR
2.0000 g | INTRAVENOUS | Status: AC
  Administered 2015-02-04: 2 g via INTRAVENOUS

## 2015-02-04 MED ORDER — ACETAMINOPHEN 325 MG PO TABS
325.0000 mg | ORAL_TABLET | ORAL | Status: DC | PRN
  Administered 2015-02-04: 650 mg via ORAL
  Filled 2015-02-04: qty 2

## 2015-02-04 MED ORDER — SODIUM CHLORIDE 0.9 % IV SOLN: INTRAVENOUS | Status: DC

## 2015-02-04 MED ORDER — FENTANYL CITRATE (PF) 100 MCG/2ML IJ SOLN
INTRAMUSCULAR | Status: AC
  Filled 2015-02-04: qty 2

## 2015-02-04 MED ORDER — SPIRONOLACTONE 25 MG PO TABS
50.0000 mg | ORAL_TABLET | Freq: Every day | ORAL | Status: DC
  Administered 2015-02-04 – 2015-02-07 (×4): 50 mg via ORAL
  Filled 2015-02-04 (×4): qty 2

## 2015-02-04 MED ORDER — METOPROLOL SUCCINATE ER 25 MG PO TB24
25.0000 mg | ORAL_TABLET | Freq: Every day | ORAL | Status: DC
  Administered 2015-02-04 – 2015-02-07 (×4): 25 mg via ORAL
  Filled 2015-02-04 (×4): qty 1

## 2015-02-04 MED ORDER — LIDOCAINE HCL (PF) 1 % IJ SOLN
INTRAMUSCULAR | Status: AC
  Filled 2015-02-04: qty 30

## 2015-02-04 MED ORDER — LIDOCAINE HCL (PF) 1 % IJ SOLN
INTRAMUSCULAR | Status: DC | PRN
  Administered 2015-02-04: 50 mL via INTRADERMAL

## 2015-02-04 MED ORDER — MIDAZOLAM HCL 5 MG/5ML IJ SOLN
INTRAMUSCULAR | Status: AC
  Filled 2015-02-04: qty 5

## 2015-02-04 MED ORDER — IOHEXOL 350 MG/ML SOLN
INTRAVENOUS | Status: DC | PRN
  Administered 2015-02-04: 10 mL via INTRAVENOUS

## 2015-02-04 MED ORDER — GENTAMICIN SULFATE 40 MG/ML IJ SOLN
80.0000 mg | INTRAMUSCULAR | Status: AC
  Administered 2015-02-04: 80 mg

## 2015-02-04 MED ORDER — SODIUM CHLORIDE 0.9 % IR SOLN
Status: AC
  Filled 2015-02-04: qty 2

## 2015-02-04 SURGICAL SUPPLY — 7 items
CABLE SURGICAL S-101-97-12 (CABLE) ×3 IMPLANT
LEAD CAPSURE NOVUS 45CM (Lead) ×3 IMPLANT
LEAD CAPSURE NOVUS 5076-52CM (Lead) ×3 IMPLANT
PAD DEFIB LIFELINK (PAD) ×3 IMPLANT
PPM ADVISA MRI DR A2DR01 (Pacemaker) ×3 IMPLANT
SHEATH CLASSIC 7F (SHEATH) ×6 IMPLANT
TRAY PACEMAKER INSERTION (PACKS) ×3 IMPLANT

## 2015-02-04 NOTE — Discharge Instructions (Signed)
° ° °  Supplemental Discharge Instructions for  °Pacemaker/Defibrillator Patients ° °Activity °No heavy lifting or vigorous activity with your left/right arm for 6 to 8 weeks.  Do not raise your left/right arm above your head for one week.  Gradually raise your affected arm as drawn below. ° °        °   02/09/15                     02/10/15                    02/11/15                    02/12/15 °__ ° °NO DRIVING for  1 week   ; you may begin driving on  02/12/15    . ° °WOUND CARE °- Keep the wound area clean and dry.  Do not get this area wet for one week. No showers for one week; you may shower on 02/12/15    . °- The tape/steri-strips on your wound will fall off; do not pull them off.  No bandage is needed on the site.  DO  NOT apply any creams, oils, or ointments to the wound area. °- If you notice any drainage or discharge from the wound, any swelling or bruising at the site, or you develop a fever > 101? F after you are discharged home, call the office at once. ° °Special Instructions °- You are still able to use cellular telephones; use the ear opposite the side where you have your pacemaker/defibrillator.  Avoid carrying your cellular phone near your device. °- When traveling through airports, show security personnel your identification card to avoid being screened in the metal detectors.  Ask the security personnel to use the hand wand. °- Avoid arc welding equipment, MRI testing (magnetic resonance imaging), TENS units (transcutaneous nerve stimulators).  Call the office for questions about other devices. °- Avoid electrical appliances that are in poor condition or are not properly grounded. °- Microwave ovens are safe to be near or to operate. ° °Additional information for defibrillator patients should your device go off: °- If your device goes off ONCE and you feel fine afterward, notify the device clinic nurses. °- If your device goes off ONCE and you do not feel well afterward, call 911. °- If your device  goes off TWICE, call 911. °- If your device goes off THREE times in one day, call 911. ° °DO NOT DRIVE YOURSELF OR A FAMILY MEMBER °WITH A DEFIBRILLATOR TO THE HOSPITAL--CALL 911. ° °

## 2015-02-04 NOTE — Progress Notes (Signed)
[facility] PROGRESS NOTE  Vernon Jackson ZOX:[id number] DOB: 07/20/1927 DOA: 02/01/2015 PCP: Quentin Mulling, MD  Assessment/Plan: #1 probable symptomatic bradycardia/Wenckeback second degree AVB Patient presenting with worsening fatigue and worsening generalized weakness to the point where he's become a two-person assist at home and per family was requiring help with feeding and patient with increased falls. Patient with history of coronary artery disease status post CABG, chronic bradycardia and Wenckeback second-degree AV block. Patient appears to be in the 2-1 AV block. Heart rate noted as low as 27 on the telemetry monitor. Heart rate in the high 20s to 30s while sleeping and in the 40s to 50s while awake. Patient has been off his beta blocker for the past 72 hours. Cardiac enzymes were slightly elevated troponins at 0.05 however seems to be plateaued. 2-D echo with EF 60-65%, NWMA.  Magnesium 2.2. Potassium at 4.6. TSH within normal limits at 1.015. Cardiology following and EP ff.EP to discuss with patient's primary cardiologist and family as to whether PPM will be beneficial.   #2 generalized weakness Likely secondary to problem #1. CT had MRI head negative for any acute abnormalities. TSH within normal limits. PT/OT.   #3 diabetes mellitus Hemoglobin A1c pending. CBGs have ranged from 148 -255. Oral hypoglycemic agents on hold. Sliding scale insulin.  #4 hypertension Continue ACE inhibitor.  #5 history atrial fibrillation Patient with bradycardia. Beta blocker on hold. Continue aspirin.  #6 chronic kidney disease stage III Stable on admission. Follow.  #7 history of TIA Continue aspirin for secondary stroke prevention.  #8 hyperlipidemia Fasting lipid panel with LDL of 81. Continue statin.  #9 constipation Patient with BM yesterday. Continue MiraLAX and Senokot-S at bedtime.   #10 prophylaxis Heparin for DVT prophylaxis.  Code Status: DO NOT RESUSCITATE Family  Communication: Updated patient and daughter and son at bedside. Disposition Plan: Remain in the step down unit, pending EP evaluation.   Consultants:  Cardiology: Dr. Wyline Mood 02/01/2015  EP: Dr Ladona Ridgel 02/02/2015  Procedures:  CT head 02/01/2015  MRI head 02/01/2015  Chest x-ray 02/01/2015  2 d echo 02/02/2015  Antibiotics:  None  HPI/Subjective: Patient sleeping, easily arousable. Patient denies any shortness of breath. No chest pain. No change in generalized weakness. HR 20-30s while sleeping. HR in 40s when awake.  Objective: Filed Vitals:   02/04/15 0418 02/04/15 0737  BP:  158/55  Pulse:    Temp: 98 F (36.7 C) 97.8 F (36.6 C)  Resp:  14    Intake/Output Summary (Last 24 hours) at 02/04/15 0836 Last data filed at 02/04/15 0700  Gross per 24 hour  Intake    680 ml  Output    750 ml  Net    -70 ml   Filed Weights   02/02/15 0322 02/03/15 0500 02/04/15 0500  Weight: 57 kg (125 lb 10.6 oz) 57.5 kg (126 lb 12.2 oz) 58 kg (127 lb 13.9 oz)    Exam:   General:  NAD  Cardiovascular: Bradycardia  Respiratory: CTAB  Abdomen: Soft, nontender, nondistended, positive bowel sounds.  Musculoskeletal: No clubbing cyanosis or edema.  Data Reviewed: Basic Metabolic Panel:  Recent Labs Lab 02/01/15 1430 02/02/15 0148 02/02/15 1010 02/03/15 0245 02/04/15 0259  NA 136  --  136 136 135  K 5.2*  --  4.6 4.6 4.6  CL 101  --  104 105 105  CO2 24  --  25 22 23   GLUCOSE 178*  --  209* 165* 172*  BUN 49*  --  35* 33* 34*  CREATININE 1.81*  --  1.47* 1.52* 1.52*  CALCIUM 9.6  --  8.8* 8.9 8.8*  MG  --  2.2  --   --  2.2   Liver Function Tests:  Recent Labs Lab 02/02/15 0148  AST 58*  ALT 34  ALKPHOS 40  BILITOT 1.0  PROT 5.9*  ALBUMIN 3.4*   No results for input(s): LIPASE, AMYLASE in the last 168 hours. No results for input(s): AMMONIA in the last 168 hours. CBC:  Recent Labs Lab 02/01/15 1430 02/02/15 1010 02/03/15 0245  WBC 6.6 4.4 5.2   HGB 13.6 12.2* 11.6*  HCT 38.5* 35.7* 33.6*  MCV 90.6 92.5 91.8  PLT 141* 122* 120*   Cardiac Enzymes:  Recent Labs Lab 02/01/15 2204 02/02/15 0148 02/02/15 1010  TROPONINI 0.05* 0.05* 0.04*   BNP (last 3 results) No results for input(s): BNP in the last 8760 hours.  ProBNP (last 3 results) No results for input(s): PROBNP in the last 8760 hours.  CBG:  Recent Labs Lab 02/03/15 0744 02/03/15 1130 02/03/15 1640 02/03/15 2133 02/04/15 0740  GLUCAP 154* 155* 109* 255* 148*    Recent Results (from the past 240 hour(s))  Culture, Urine     Status: None   Collection Time: 02/01/15  7:23 PM  Result Value Ref Range Status   Specimen Description URINE, RANDOM  Final   Special Requests NONE  Final   Culture NO GROWTH 2 DAYS  Final   Report Status 02/03/2015 FINAL  Final  MRSA PCR Screening     Status: None   Collection Time: 02/01/15  9:29 PM  Result Value Ref Range Status   MRSA by PCR NEGATIVE NEGATIVE Final    Comment:        The GeneXpert MRSA Assay (FDA approved for NASAL specimens only), is one component of a comprehensive MRSA colonization surveillance program. It is not intended to diagnose MRSA infection nor to guide or monitor treatment for MRSA infections.      Studies: No results found.  Scheduled Meds: . aspirin EC  325 mg Oral Daily  . feeding supplement (GLUCERNA SHAKE)  237 mL Oral BID BM  . heparin  5,000 Units Subcutaneous 3 times per day  . insulin aspart  0-9 Units Subcutaneous TID WC  . lisinopril  10 mg Oral Daily  . LORazepam  1 mg Oral Once  . polyethylene glycol  17 g Oral BID  . senna  1 tablet Oral BID  . simvastatin  20 mg Oral QPM  . sodium chloride  3 mL Intravenous Q12H   Continuous Infusions:    Principal Problem:   Symptomatic bradycardia: Probable Active Problems:   Wenckebach second degree AV block   Generalized weakness   Coronary artery disease   CAD (coronary artery disease)   Hypertension    Hyperlipidemia   Diabetes mellitus without complication ([facility])   History of BPH   Atrial fibrillation ([facility])   TIA (transient ischemic attack)   Chronic renal insufficiency   Weakness   Weakness generalized   Constipation   Malnutrition of moderate degree    Time spent: 40 mins    [facility] LPHOMPSON,DANIEL MD [facility] Pager [phone]. If 7PM-7AM, please contact night-coverage at [email or web address], password [email or web address] 02/04/2015, 8:36 AM  LOS: 3 days

## 2015-02-04 NOTE — Care Management Important Message (Signed)
Important Message  Patient Details  Name: Vernon Jackson MRN: [id number] Date of Birth: 11/24/1927   Medicare Important Message Given:  Yes    [name] [name] 02/04/2015, 3:37 PM

## 2015-02-04 NOTE — Care Management Important Message (Signed)
Important Message  Patient Details  Name: Vernon MackintoshRichard H L [place] MRN: [id number] Date of Birth: 06/19/1927   Medicare Important Message Given:  Yes    Bernadette HoitShoffner, Regina Ganci Coleman 02/04/2015, 12:42 PM

## 2015-02-04 NOTE — H&P (View-Only) (Signed)
Reason for Consult:2:1 heart block  Referring Physician: Dr. Erasmo DownerBerry  Vernon Jackson is an 82 y.o. male.   HPI: The patient is an 82 yo man with multiple medical problems who was admitted with generalized weakness. The patient has had documented bradycardia in the past with ECG's dating back 2 years ago demonstrating HR of 40. His ECG several months ago demonstrated a HR of 38/min. With 2:1 AV block. He has had trouble with his blood sugar and does not a syncopal episode several days ago. He has been off of all AV nodal blocking drugs.  He denies chest pain or sob or nausea or vomiting.  PMH: Past Medical History  Diagnosis Date  . Coronary artery disease     Bypass graft surgeryx4 03/2009  . CAD (coronary artery disease) 03 01 2011    EF 60%, mild MR   . Hypertension   . Hyperlipidemia   . Diabetes mellitus without complication (HCC)   . History of BPH   . Chest pain     /1/11 echocardiogram EF 60%, mild MR,  . Wenckebach second degree AV block     PSHX: Past Surgical History  Procedure Laterality Date  . Cardiac catheterization      CABG X 4 vessel complicated by Atrial fib and bradycardia    FAMHX: Family History  Problem Relation Age of Onset  . Diabetes Father   . Diabetes Brother   . Diabetes Brother   . Diabetes Son     Social History:  reports that he has quit smoking. He does not have any smokeless tobacco history on file. He reports that he does not drink alcohol or use illicit drugs.  Allergies: No Known Allergies  Medications: I have reviewed the patient's current medications.  Ct Head Wo Contrast  02/01/2015  CLINICAL DATA:  Syncope yesterday. Weakness in both legs since yesterday. Fall. EXAM: CT HEAD WITHOUT CONTRAST TECHNIQUE: Contiguous axial images were obtained from the base of the skull through the vertex without intravenous contrast. COMPARISON:  04/10/2013 FINDINGS: Remote lacunar infarct along the posterior limb left internal capsule/ left  posterior lentiform nucleus. Otherwise, the brainstem, cerebellum, cerebral peduncles, thalami, basal ganglia, basilar cisterns, and ventricular system appear within normal limits. Periventricular white matter and corona radiata hypodensities favor chronic ischemic microvascular white matter disease. No intracranial hemorrhage, mass lesion, or acute CVA. There is atherosclerotic calcification of the cavernous carotid arteries bilaterally. IMPRESSION: 1. No acute intracranial findings. 2. Periventricular white matter and corona radiata hypodensities favor chronic ischemic microvascular white matter disease. 3. Remote lacunar infarct along the posterior limb left internal capsule/left posterior lentiform nucleus. Electronically Signed   By: Gaylyn RongWalter  Liebkemann M.D.   On: 02/01/2015 16:49   Mr Brain Wo Contrast  02/01/2015  CLINICAL DATA:  One week of worsening generalized weakness, pre syncopal episode this afternoon, possibly related to heart block. History of heart block, hypertension, hyperlipidemia, diabetes. EXAM: MRI HEAD WITHOUT CONTRAST TECHNIQUE: Multiplanar, multiecho pulse sequences of the brain and surrounding structures were obtained without intravenous contrast. COMPARISON:  CT head February 01, 2015 at 1634 hours FINDINGS: The ventricles and sulci are normal for patient's age. No abnormal parenchymal signal, mass lesions, mass effect. No reduced diffusion to suggest acute ischemia. No susceptibility artifact to suggest hemorrhage. Old bilateral basal ganglia lacunar infarct with mild gliosis. Patchy supratentorial white matter T2 hyperintensities are less than expected for age. No abnormal extra-axial fluid collections. Mildly prominent posterior fossa greater than supratentorial extra-axial spaces are nonspecific.  No extra-axial masses though, contrast enhanced sequences would be more sensitive. Normal major intracranial vascular flow voids seen at the skull base. Ocular globes and orbital contents  are nonsuspicious though not tailored for evaluation. Status post bilateral ocular lens implants. No abnormal sellar expansion. No suspicious calvarial bone marrow signal. Craniocervical junction maintained. Visualized paranasal sinus are well aerated. Trace RIGHT greater LEFT mastoid effusion. Patient is edentulous. IMPRESSION: No acute intracranial process. Involutional changes. Mild chronic small vessel ischemic disease. Old bilateral basal ganglia lacunar infarcts. Electronically Signed   By: Awilda Metro M.D.   On: 02/01/2015 20:57   Portable Chest 1 View  02/02/2015  CLINICAL DATA:  82 year old male with weakness. EXAM: PORTABLE CHEST 1 VIEW COMPARISON:  Radiograph dated 05/21/2009 FINDINGS: Single-view of the chest does not demonstrate a focal consolidation. There is no pleural effusion or pneumothorax. Stable cardiac silhouette. Median sternotomy wires. Degenerative changes of the shoulders. IMPRESSION: No active disease. Electronically Signed   By: Elgie Collard M.D.   On: 02/02/2015 01:50    ROS  As stated in the HPI and negative for all other systems.  Physical Exam  Vitals:Blood pressure 167/55, pulse 41, temperature 97.6 F (36.4 C), temperature source Oral, resp. rate 14, height 5\' 5"  (1.651 m), weight 126 lb 12.2 oz (57.5 kg), SpO2 98 %.  diskempt appearing elderly man NAD HEENT: Unremarkable Neck:  7 cm JVD, no thyromegally Lymphatics:  No adenopathy Back:  No CVA tenderness Lungs:  Clear with no wheezes HEART:  IRegular brady rhythm, no murmurs, no rubs, no clicks Abd:  soft, positive bowel sounds, no organomegally, no rebound, no guarding Ext:  2 plus pulses, no edema, no cyanosis, no clubbing Skin:  No rashes no nodules Neuro:  CN II through XII intact, motor grossly intact  Tele - nsr with 2:1 AV block along with lesser AV block  ECG - NSR with 2;1AV block  Assessment/Plan: 1. Generalized fatigue and weakness 2. 2:1 AV block, longstanding 3. DM Rec:  while his AV block may contributing to his symptoms, he had a HR of 38/min in the office back in the summer. I suspect his symptoms of weakness are multifactorial and I am concerned he will be just as weak after a PPM. I will discuss with Dr. Eldridge Dace tomorrow. I spoke and shared my concerns with the patient and Dr. Janee Morn. We will make a decision about PPM tomorrow.  Sharlot Gowda TaylorMD 02/03/2015, 8:56 AM

## 2015-02-04 NOTE — Care Management Note (Addendum)
Case Management Note  Patient Details  Name: Vernon Jackson MRN: [id number] Date of Birth: 06/02/1927  Subjective/Objective:        Adm w bradycardia            Action/Plan: lives w fam, pcp dr Genelle Balj hooper   Expected Discharge Date:                  Expected Discharge Plan:     In-House Referral:     Discharge planning Services     Post Acute Care Choice:    Choice offered to:     DME Arranged:    DME Agency:     HH Arranged:    HH Agency:     Status of Service:     Medicare Important Message Given:    Date Medicare IM Given:    Medicare IM give by:    Date Additional Medicare IM Given:    Additional Medicare Important Message give by:     If discussed at Long Length of Stay Meetings, dates discussed:    Additional Comments: ur review done  Hanley HaysDowell, Areta Terwilliger T, RN 02/04/2015, 9:24 AM

## 2015-02-04 NOTE — Progress Notes (Signed)
SUBJECTIVE:  No complaints  OBJECTIVE:   Vitals:   Filed Vitals:   02/04/15 0400 02/04/15 0418 02/04/15 0500 02/04/15 0737  BP: 182/60   158/55  Pulse:      Temp:  98 F (36.7 C)  97.8 F (36.6 C)  TempSrc:  Oral  Oral  Resp: 15   14  Height:      Weight:   127 lb 13.9 oz (58 kg)   SpO2:    95%   I&O's:   Intake/Output Summary (Last 24 hours) at 02/04/15 0804 Last data filed at 02/04/15 0700  Gross per 24 hour  Intake    680 ml  Output    750 ml  Net    -70 ml   TELEMETRY: Reviewed telemetry pt in : 2:1 AV block     PHYSICAL EXAM General: Well developed, well nourished, in no acute distress Head: Eyes PERRLA, No xanthomas.   Normal cephalic and atramatic  Lungs:   Clear bilaterally to auscultation and percussion. Heart:   HRRR S1 S2 Pulses are 2+ & equal. Abdomen: Bowel sounds are positive, abdomen soft and non-tender without masses  Extremities:   No clubbing, cyanosis or edema.  DP +1 Neuro: Alert and oriented X 3. Psych:  Good affect, responds appropriately   LABS: Basic Metabolic Panel:  Recent Labs  16/10/96 0148  02/03/15 0245 02/04/15 0259  NA  --   < > 136 135  K  --   < > 4.6 4.6  CL  --   < > 105 105  CO2  --   < > 22 23  GLUCOSE  --   < > 165* 172*  BUN  --   < > 33* 34*  CREATININE  --   < > 1.52* 1.52*  CALCIUM  --   < > 8.9 8.8*  MG 2.2  --   --  2.2  < > = values in this interval not displayed. Liver Function Tests:  Recent Labs  02/02/15 0148  AST 58*  ALT 34  ALKPHOS 40  BILITOT 1.0  PROT 5.9*  ALBUMIN 3.4*   No results for input(s): LIPASE, AMYLASE in the last 72 hours. CBC:  Recent Labs  02/02/15 1010 02/03/15 0245  WBC 4.4 5.2  HGB 12.2* 11.6*  HCT 35.7* 33.6*  MCV 92.5 91.8  PLT 122* 120*   Cardiac Enzymes:  Recent Labs  02/01/15 2204 02/02/15 0148 02/02/15 1010  TROPONINI 0.05* 0.05* 0.04*   BNP: Invalid input(s): POCBNP D-Dimer: No results for input(s): DDIMER in the last 72 hours. Hemoglobin  A1C: No results for input(s): HGBA1C in the last 72 hours. Fasting Lipid Panel:  Recent Labs  02/02/15 0230  CHOL 141  HDL 39*  LDLCALC 81  TRIG 045  CHOLHDL 3.6   Thyroid Function Tests:  Recent Labs  02/01/15 2204  TSH 1.015   Anemia Panel: No results for input(s): VITAMINB12, FOLATE, FERRITIN, TIBC, IRON, RETICCTPCT in the last 72 hours. Coag Panel:   Lab Results  Component Value Date   INR 1.30 02/02/2015   INR 1.13 04/09/2013   INR 1.67* 03/20/2009    RADIOLOGY: Ct Head Wo Contrast  02/01/2015  CLINICAL DATA:  Syncope yesterday. Weakness in both legs since yesterday. Fall. EXAM: CT HEAD WITHOUT CONTRAST TECHNIQUE: Contiguous axial images were obtained from the base of the skull through the vertex without intravenous contrast. COMPARISON:  04/10/2013 FINDINGS: Remote lacunar infarct along the posterior limb left internal capsule/ left posterior  lentiform nucleus. Otherwise, the brainstem, cerebellum, cerebral peduncles, thalami, basal ganglia, basilar cisterns, and ventricular system appear within normal limits. Periventricular white matter and corona radiata hypodensities favor chronic ischemic microvascular white matter disease. No intracranial hemorrhage, mass lesion, or acute CVA. There is atherosclerotic calcification of the cavernous carotid arteries bilaterally. IMPRESSION: 1. No acute intracranial findings. 2. Periventricular white matter and corona radiata hypodensities favor chronic ischemic microvascular white matter disease. 3. Remote lacunar infarct along the posterior limb left internal capsule/left posterior lentiform nucleus. Electronically Signed   By: Gaylyn RongWalter  Liebkemann M.D.   On: 02/01/2015 16:49   Mr Brain Wo Contrast  02/01/2015  CLINICAL DATA:  One week of worsening generalized weakness, pre syncopal episode this afternoon, possibly related to heart block. History of heart block, hypertension, hyperlipidemia, diabetes. EXAM: MRI HEAD WITHOUT CONTRAST  TECHNIQUE: Multiplanar, multiecho pulse sequences of the brain and surrounding structures were obtained without intravenous contrast. COMPARISON:  CT head February 01, 2015 at 1634 hours FINDINGS: The ventricles and sulci are normal for patient's age. No abnormal parenchymal signal, mass lesions, mass effect. No reduced diffusion to suggest acute ischemia. No susceptibility artifact to suggest hemorrhage. Old bilateral basal ganglia lacunar infarct with mild gliosis. Patchy supratentorial white matter T2 hyperintensities are less than expected for age. No abnormal extra-axial fluid collections. Mildly prominent posterior fossa greater than supratentorial extra-axial spaces are nonspecific. No extra-axial masses though, contrast enhanced sequences would be more sensitive. Normal major intracranial vascular flow voids seen at the skull base. Ocular globes and orbital contents are nonsuspicious though not tailored for evaluation. Status post bilateral ocular lens implants. No abnormal sellar expansion. No suspicious calvarial bone marrow signal. Craniocervical junction maintained. Visualized paranasal sinus are well aerated. Trace RIGHT greater LEFT mastoid effusion. Patient is edentulous. IMPRESSION: No acute intracranial process. Involutional changes. Mild chronic small vessel ischemic disease. Old bilateral basal ganglia lacunar infarcts. Electronically Signed   By: Awilda Metroourtnay  Bloomer M.D.   On: 02/01/2015 20:57   Portable Chest 1 View  02/02/2015  CLINICAL DATA:  82 year old male with weakness. EXAM: PORTABLE CHEST 1 VIEW COMPARISON:  Radiograph dated 05/21/2009 FINDINGS: Single-view of the chest does not demonstrate a focal consolidation. There is no pleural effusion or pneumothorax. Stable cardiac silhouette. Median sternotomy wires. Degenerative changes of the shoulders. IMPRESSION: No active disease. Electronically Signed   By: Elgie CollardArash  Radparvar M.D.   On: 02/02/2015 01:50   Assessment/Plan:   Principal  Problem: Symptomatic bradycardia: Probable Active Problems: Coronary artery disease Hypertension Hyperlipidemia Diabetes mellitus without complication (HCC) Atrial fibrillation (HCC) TIA (transient ischemic attack) Wenckebach second degree AV block Chronic renal insufficiency Generalized weakness Constipation Malnutrition of moderate degree  1.  2:1 AVB with heart rates in the 20-30's while asleep and 40's while awake.  Hemodynamically stable with BP in the 150-180's/50-60's.  Appreciate EP input.  Will await further decision regarding PM. 2. Weakness felt to be multifactorial. 3.  ASCAD with no angina.   4.  HTN - borderline controlled.  Continue ACE I and then titrate up as needed post PPM    Quintella ReichertURNER,TRACI R, MD  02/04/2015  8:04 AM

## 2015-02-04 NOTE — Progress Notes (Signed)
PT Cancellation Note  Patient Details Name: Vernon MackintoshRichard H L Jackson MRN: [id number] DOB: 01/18/1928   Cancelled Treatment:    Reason Eval/Treat Not Completed: Medical issues which prohibited therapy (Order for bedrest after pacemaker placement until am). Will check back in am.  Thanks.     Tawni MillersWhite, Jemila Camille F 02/04/2015, 2:22 PM Mersadez Linden,PT Acute Rehabilitation [phone] (pager)

## 2015-02-04 NOTE — Interval H&P Note (Signed)
History and Physical Interval Note:  02/04/2015 10:34 AM  Vernon Jackson  has presented today for surgery, with the diagnosis of hb  The various methods of treatment have been discussed with the patient and family. After consideration of risks, benefits and other options for treatment, the patient has consented to  Procedure(s): Pacemaker Implant (N/A) as a surgical intervention .  The patient's history has been reviewed, patient examined, no change in status, stable for surgery.  I have reviewed the patient's chart and labs.  Questions were answered to the patient's satisfaction.     Lewayne BuntingGregg Taylor, M.D.

## 2015-02-04 NOTE — Progress Notes (Signed)
PT Cancellation Note  Patient Details Name: Vernon MackintoshRichard H L Plaut MRN: [id number] DOB: 08/27/1927   Cancelled Treatment:    Reason Eval/Treat Not Completed: Patient at procedure or test/unavailable (Checked on pt in am and pt getting pacemaker.  Will check back as able.  Thanks.)   Tawni MillersWhite, Leonard Hendler F 02/04/2015, 12:56 PM  Teana Lindahl,PT Acute Rehabilitation [phone] (pager)

## 2015-02-05 ENCOUNTER — Encounter (HOSPITAL_COMMUNITY): Payer: Self-pay | Admitting: Internal Medicine

## 2015-02-05 ENCOUNTER — Inpatient Hospital Stay (HOSPITAL_COMMUNITY): Payer: Medicare HMO

## 2015-02-05 DIAGNOSIS — Z950 Presence of cardiac pacemaker: Secondary | ICD-10-CM

## 2015-02-05 LAB — GLUCOSE, CAPILLARY
GLUCOSE-CAPILLARY: 176 mg/dL — AB (ref 65–99)
Glucose-Capillary: 146 mg/dL — ABNORMAL HIGH (ref 65–99)
Glucose-Capillary: 202 mg/dL — ABNORMAL HIGH (ref 65–99)
Glucose-Capillary: 122 mg/dL — ABNORMAL HIGH (ref 65–99)

## 2015-02-05 LAB — BASIC METABOLIC PANEL
Anion gap: 6 (ref 5–15)
BUN: 31 mg/dL — AB (ref 6–20)
CALCIUM: 8.8 mg/dL — AB (ref 8.9–10.3)
CO2: 23 mmol/L (ref 22–32)
CREATININE: 1.3 mg/dL — AB (ref 0.61–1.24)
Chloride: 103 mmol/L (ref 101–111)
GFR calc non Af Amer: 48 mL/min — ABNORMAL LOW (ref >60)
GFR, EST AFRICAN AMERICAN: 56 mL/min — AB (ref >60)
GLUCOSE: 164 mg/dL — AB (ref 65–99)
Potassium: 4.6 mmol/L (ref 3.5–5.1)
Sodium: 132 mmol/L — ABNORMAL LOW (ref 135–145)

## 2015-02-05 LAB — HEMOGLOBIN A1C
Hgb A1c MFr Bld: 7.1 % — ABNORMAL HIGH (ref 4.8–5.6)
Mean Plasma Glucose: 157 mg/dL

## 2015-02-05 MED ORDER — OXYCODONE HCL 5 MG PO TABS: 5.0000 mg | ORAL_TABLET | ORAL | Status: DC | PRN

## 2015-02-05 MED ORDER — SENNA 8.6 MG PO TABS: 1.0000 | ORAL_TABLET | Freq: Every day | ORAL | Status: DC

## 2015-02-05 MED ORDER — GLUCERNA SHAKE PO LIQD: 237.0000 mL | Freq: Two times a day (BID) | ORAL | Status: DC

## 2015-02-05 MED ORDER — POLYETHYLENE GLYCOL 3350 17 G PO PACK: 17.0000 g | PACK | Freq: Two times a day (BID) | ORAL | Status: AC

## 2015-02-05 NOTE — NC FL2 (Signed)
[place] MEDICAID FL2 LEVEL OF CARE SCREENING TOOL     IDENTIFICATION  Patient Name: Vernon Jackson Birthdate: 1927-08-04 Sex: male Admission Date (Current Location): 02/01/2015  [facility] and [place] Number:  Producer, television/film/video and Address:  [facility] [place], [address] [place], [place] [address]      Provider Number: [id number]  Attending Physician Name and Address:  Rodolph Bong, MD  Relative Name and Phone Number:       Current Level of Care: Hospital Recommended Level of Care: Skilled Nursing Facility Prior Approval Number:    Date Approved/Denied:   PASRR Number:    Discharge Plan: Home    Current Diagnoses: Patient Active Problem List   Diagnosis Date Noted  . Pacemaker   . Malnutrition of moderate degree 02/03/2015  . Weakness 02/01/2015  . Generalized weakness 02/01/2015  . Symptomatic bradycardia: Probable 02/01/2015  . Weakness generalized 02/01/2015  . Constipation 02/01/2015  . Chronic renal insufficiency 08/15/2014  . Wenckebach second degree AV block   . Atrial fibrillation (HCC) 04/09/2013  . TIA (transient ischemic attack) 04/09/2013  . Stroke (HCC) 04/09/2013  . Pulsatile abdominal mass 10/20/2012  . Coronary artery disease   . Hypertension   . Hyperlipidemia   . Diabetes mellitus without complication (HCC)   . History of BPH   . CAD (coronary artery disease) 03/19/2009    Orientation RESPIRATION BLADDER Height & Weight    Self, Time, Situation, Place  Normal Continent  (165.1 cm) 126 lbs.  BEHAVIORAL SYMPTOMS/MOOD NEUROLOGICAL BOWEL NUTRITION STATUS      Continent Diet (heart healthy/carb modified)  AMBULATORY STATUS COMMUNICATION OF NEEDS Skin   Extensive Assist Verbally Normal                       Personal Care Assistance Level of Assistance  Bathing, Feeding, Dressing Bathing Assistance: Maximum assistance Feeding assistance: Limited assistance Dressing Assistance: Maximum  assistance     Functional Limitations Info             SPECIAL CARE FACTORS FREQUENCY  PT (By licensed PT), OT (By licensed OT)                    Contractures Contractures Info: Not present    Additional Factors Info  Insulin Sliding Scale       Insulin Sliding Scale Info:  (3x/day)       Current Medications (02/05/2015):  This is the current hospital active medication list Current Facility-Administered Medications  Medication Dose Route Frequency Provider Last Rate Last Dose  . acetaminophen (TYLENOL) suppository 650 mg  650 mg Rectal Q6H PRN Rodolph Bong, MD      . acetaminophen (TYLENOL) tablet 325-650 mg  325-650 mg Oral Q4H PRN Marinus Maw, MD   650 mg at 02/04/15 1747  . albuterol (PROVENTIL) (2.5 MG/3ML) 0.083% nebulizer solution 2.5 mg  2.5 mg Nebulization Q2H PRN Rodolph Bong, MD      . alum & mag hydroxide-simeth (MAALOX/MYLANTA) 200-200-20 MG/5ML suspension 30 mL  30 mL Oral Q6H PRN Rodolph Bong, MD      . aspirin EC tablet 325 mg  325 mg Oral Daily Rodolph Bong, MD   325 mg at 02/05/15 1005  . atropine injection 0.4 mg  0.4 mg Intravenous PRN Rodolph Bong, MD      . feeding supplement (GLUCERNA SHAKE) (GLUCERNA SHAKE) liquid 237 mL  237 mL  Oral BID BM Marinell Blight, RD   237 mL at 02/04/15 1400  . heparin injection 5,000 Units  5,000 Units Subcutaneous 3 times per day Rodolph Bong, MD   5,000 Units at 02/04/15 2248  . insulin aspart (novoLOG) injection 0-9 Units  0-9 Units Subcutaneous TID WC Rodolph Bong, MD   3 Units at 02/05/15 1857  . lisinopril (PRINIVIL,ZESTRIL) tablet 10 mg  10 mg Oral Daily Rodolph Bong, MD   10 mg at 02/05/15 1005  . LORazepam (ATIVAN) tablet 1 mg  1 mg Oral Once Alvira Monday, MD   1 mg at 02/01/15 2303  . metoprolol succinate (TOPROL-XL) 24 hr tablet 25 mg  25 mg Oral Daily Marinus Maw, MD   25 mg at 02/05/15 1005  . ondansetron (ZOFRAN) injection 4 mg  4 mg Intravenous Q6H PRN  Marinus Maw, MD      . ondansetron [facility]) tablet 4 mg  4 mg Oral Q6H PRN Rodolph Bong, MD      . oxyCODONE (Oxy IR/ROXICODONE) immediate release tablet 5 mg  5 mg Oral Q4H PRN Rodolph Bong, MD      . polyethylene glycol (MIRALAX / GLYCOLAX) packet 17 g  17 g Oral BID Rodolph Bong, MD   17 g at 02/05/15 1005  . senna (SENOKOT) tablet 8.6 mg  1 tablet Oral BID Rodolph Bong, MD   8.6 mg at 02/05/15 1005  . simvastatin (ZOCOR) tablet 20 mg  20 mg Oral QPM Rodolph Bong, MD   20 mg at 02/05/15 1857  . sodium chloride 0.9 % injection 3 mL  3 mL Intravenous Q12H Rodolph Bong, MD   3 mL at 02/04/15 0911  . sorbitol 70 % solution 30 mL  30 mL Oral Daily PRN Rodolph Bong, MD      . spironolactone (ALDACTONE) tablet 50 mg  50 mg Oral Daily Marinus Maw, MD   50 mg at 02/05/15 1005  . zolpidem (AMBIEN) tablet 2.5-5 mg  2.5-5 mg Oral QHS PRN Rodolph Bong, MD         Discharge Medications: Please see discharge summary for a list of discharge medications.  Relevant Imaging Results:  Relevant Lab Results:   Additional Information    Kendrick Haapala M, LCSW

## 2015-02-05 NOTE — Evaluation (Signed)
Physical Therapy Evaluation Patient Details Name: [facility] MRN: [id number] DOB: April 14, 1927 Today's Date: 1/17/Jackson   History of Present Illness  82 yo male adm 02/01/15  through [facility] with worsening weakness; PMHx:  HTN,CAD, EF 60%, DM, CABGx4  Clinical Impression  Pt admitted with above diagnosis. Pt currently with functional limitations due to the deficits listed below (see PT Problem List).   Pt will benefit from skilled PT to increase their independence and safety with mobility to allow discharge to the venue listed below.    Pt is pleasant and motivated to get OOB this am; He is profoundly  Weak and deconditioned, requiring max assist for basic mobility at this time; strongly recommend SNF     Follow Up Recommendations SNF;Supervision/Assistance - 24 hour    Equipment Recommendations  None recommended by PT    Recommendations for Other Services       Precautions / Restrictions Precautions Precautions: ICD/Pacemaker Restrictions Weight Bearing Restrictions: No      Mobility  Bed Mobility Overal bed mobility: Needs Assistance Bed Mobility: Supine to Sit     Supine to sit: Max assist     General bed mobility comments: incr time, multi-modal cues for technique, PM precautions LUE, assist with trunk and LEs   Transfers Overall transfer level: Needs assistance Equipment used: Rolling walker (2 wheeled);None Transfers: Sit to/from [facility] (sit to stand x 2) Sit to Stand: Max assist Stand pivot transfers: Max assist       General transfer comment:  multi-modal cues for hand placement, anterior -superior wt shift, assist to rise, for balance throughout transfer  Ambulation/Gait       Gait Pattern/deviations: Trunk flexed;Shuffle;Narrow base of support     General Gait Details: pivotal steps/ pre-gait wt lateral wt shifting, marching bil LEs in standing with max assist  Stairs            Wheelchair Mobility    Modified  Rankin (Stroke Patients Only)       Balance Overall balance assessment: Needs assistance Sitting-balance support: Feet supported;Single extremity supported Sitting balance-Leahy Scale: Poor   Postural control: Posterior lean Standing balance support: Bilateral upper extremity supported;During functional activity Standing balance-Leahy Scale: Zero Standing balance comment: posterior lean in standing requiring max assist to maintain midline                             Pertinent Vitals/Pain Pain Assessment: No/denies pain    Home Living Family/patient expects to be discharged to:: Unsure Living Arrangements: Children               Additional Comments: lives with son that gets up before pt and goes to work until 5:30pm, per dtr-in-law son is "mentally challenged" and goes to bed at 7:30, he is not able to really help pt    Prior Function Level of Independence: Needs assistance   Gait / Transfers Assistance Needed: able to amb with RW with supervision prior to adm, very poor balance, bumping into walls, without it per dtr in law  ADL's / Homemaking Assistance Needed: requiring assist of 2 for toilet transfers prior to adm per dtr-in-law, [facility] (who is an Charity fundraiser)  Comments: has a rollator but doesn't use it     Hand Dominance        Extremity/Trunk Assessment   Upper Extremity Assessment: Defer to OT evaluation           Lower Extremity  Assessment: Generalized weakness;RLE deficits/detail;LLE deficits/detail RLE Deficits / Details: demo's moderate rigidity in bil LEs, able to move through [facility] with incr time, ankles onlyto neutral       Communication   Communication: No difficulties  Cognition Arousal/Alertness: Awake/alert Behavior During Therapy: WFL for tasks assessed/performed Overall Cognitive Status: Impaired/Different from baseline Area of Impairment: Problem solving;Following commands       Following Commands: Follows one step commands  with increased time     Problem Solving: Decreased initiation;Slow processing;Difficulty sequencing;Requires verbal cues;Requires tactile cues      General Comments      Exercises        Assessment/Plan    PT Assessment Patient needs continued PT services  PT Diagnosis Generalized weakness;Difficulty walking   PT Problem List Decreased strength;Decreased activity tolerance;Decreased balance;Decreased mobility;Decreased coordination;Decreased knowledge of use of DME;Decreased knowledge of precautions  PT Treatment Interventions DME instruction;Gait training;Functional mobility training;Therapeutic activities;Patient/family education;Therapeutic exercise   PT Goals (Current goals can be found in the Care Plan section) Acute Rehab PT Goals Patient Stated Goal: to get better PT Goal Formulation: With patient Time For Goal Achievement: 02/19/15 Potential to Achieve Goals: Good    Frequency Min 3X/week   Barriers to discharge        Co-evaluation               End of Session   Activity Tolerance: Patient tolerated treatment well Patient left: in chair;with call bell/phone within reach;with family/visitor present           Time: 1610-9604 PT Time Calculation (min) (ACUTE ONLY): 30 min   Charges:   PT Evaluation $PT Eval Moderate Complexity: 1 Procedure PT Treatments $Therapeutic Activity: 8-22 mins   PT G Codes:        [facility], 11:05 AM

## 2015-02-05 NOTE — Plan of Care (Signed)
Problem: Phase III Progression Outcomes Goal: Ambulating in room or hall Outcome: Not Progressing Pt being discharged to SNF for rehab, unable to ambulate at this time

## 2015-02-05 NOTE — Discharge Summary (Signed)
Physician Discharge Summary  MOHAMED PORTLOCK WJX:[id number] DOB: 1927-09-06 DOA: 02/01/2015  PCP: Quentin Mulling, MD  Admit date: 02/01/2015 Discharge date: 02/05/2015  Time spent: 65 minutes  Recommendations for Outpatient Follow-up:  1. Discharge to skilled nursing facility. Follow-up with M.D. at the skilled nursing facility. Patient will need a basic metabolic profile done to follow-up on electrolytes and renal function. 2. Follow-up with Dr. Ladona Ridgel at 12:00 on 05/07/2015. Patient will also need to follow-up at the wound clinic ad lib. hour heart on 02/08/2015 and 02/14/2015.   Discharge Diagnoses:  Principal Problem:   Symptomatic bradycardia: Probable Active Problems:   Wenckebach second degree AV block   Generalized weakness   Coronary artery disease   CAD (coronary artery disease)   Hypertension   Hyperlipidemia   Diabetes mellitus without complication (HCC)   History of BPH   Atrial fibrillation (HCC)   TIA (transient ischemic attack)   Chronic renal insufficiency   Weakness   Weakness generalized   Constipation   Malnutrition of moderate degree   Pacemaker   Discharge Condition: Stable and improved  Diet recommendation: carb modified.  Filed Weights   02/03/15 0500 02/04/15 0500 02/05/15 0402  Weight: 57.5 kg (126 lb 12.2 oz) 58 kg (127 lb 13.9 oz) 57.425 kg (126 lb 9.6 oz)    History of present illness:  [facility] is a 82 y.o. male  With history of hypertension, hyperlipidemia, coronary artery disease status post CABG 03/2009, diabetes mellitus, Wenckebach came back second-degree AV block who presented to the ED with a 5 to seven-day history of worsening generalized weakness. Most of the history was obtained from daughter-in-law and patient. 3 days prior to admission around 2 PM in the afternoon patient in conversation with his son felt he might be going into shock from his diabetes and felt very weak. He also had symptoms of presyncope at that time  however did not have any noticeable syncopal episode. A fireman who stayed next door assessed the patient and gave the patient some orange juice as well as some thing to eat patient became more alert and oriented. Blood sugars taken after patient had eaten something was 335. CBG was not drawn at the time of his symptoms. Patient's son and daughter-in-law went to see the patient at that time and patient was noticeably very weak and usually uses a walker at baseline. Patient did note that his heart rate has been in the 40s. Per family patient with increasing falls. Patient has not taken his beta blocker over the past couple of days. Patient's daughter-in-law and son moved patient into their home as they felt patient was too weak to take care of himself. The night prior to admission family was concerned patient may have had a TIA as patient was noted to have some slurred speech which lasted 1-2 minutes while on the phone and patient was noted to have some drooling and significant weakness to the point where he was a two-person assist. It was also noted that patient needed help to feed himself. No seizures noted. Patient endorses constipation. Patient denies any recent fevers, no chills, no chest pain, no shortness of breath, no nausea, no vomiting, no abdominal pain, no diarrhea, no dysuria, no cough, no melena, no hematemesis, no hematochezia, no other associated symptoms. Patient was seen in the emergency room CT head which was done was negative for any acute abnormalities. Basic metabolic profile had a potassium of 5.2 BUN of 49 creatinine of 1.81 otherwise  was within normal limits. Point-of-care troponin was negative. CBC was unremarkable. CBC was 178. Urinalysis was nitrite negative leukocytes negative. Chest x-ray was not done. [facility] hospitalist was called to admit the patient for further evaluation and management. Patient very hesitant to be admitted.  Hospital Course:  #1 probable symptomatic  bradycardia/Wenckeback second degree AVB Patient presented with worsening fatigue and worsening generalized weakness to the point where he's became a two-person assist at home and per family was requiring help with feeding and patient with increased falls. Patient with history of coronary artery disease status post CABG, chronic bradycardia and Wenckeback second-degree AV block. Patient appears to be in the 2-1 AV block. Heart rate  on initial admission was noted as low as 27 on the telemetry monitor. Heart rate in the high 20s to 30s while sleeping and in the 40s to 50s while awake. Patient was off his beta blocker 48 hours prior to admission and initially held during the hospitalization. Cardiac enzymes were slightly elevated troponins at 0.05 however seemed to be plateaued. 2-D echo with EF 60-65%, NWMA. Magnesium 2.2. Potassium at 4.6. TSH within normal limits at 1.015. Cardiology  was consulted and followed the patient during the hospitalization. Electrophysiology was also consulted and patient subsequently got a pacemaker placement on 02/04/2015. Post pacemaker placement hematoma was noted around site however site was without any signs of infection. Gauze was applied for pressure. Patient will be discharged to a skilled nursing facility and will need to follow-up with electrophysiology as outpatient.  #2 generalized weakness Likely secondary to problem #1. CT had MRI head negative for any acute abnormalities. TSH within normal limits. PT/OT. Patient was seen by physical therapy who recommended skilled nursing facility.  #3 diabetes mellitus Hemoglobin A1c 7.1 during this hospitalization. Patient's 70/30 was held and patient was maintained on sliding scale throughout the hospitalization. Patient will be discharged back to skilled nursing facility on his home regimen of 70/30.   #4 hypertension On admission patient's beta blocker and calcium channel blocker were held secondary to patient's  bradycardia. Patient was maintained on his ACE inhibitor for blood pressure control. After patient had pacemaker placed patient was resumed back on his home regimen of metoprolol. Outpatient follow-up.  #5 history atrial fibrillation Patient with bradycardia. Beta blocker was held during the hospitalization. Patient was maintained on aspirin. Patient was followed by electrophysiology and post pacemaker placement patient's beta blocker was resumed. Patient not a anticoagulation candidate secondary to recent falls. Continued on aspirin.  #6 chronic kidney disease stage III Stable on admission. Follow.  #7 history of TIA Continued on aspirin for secondary stroke prevention.  #8 hyperlipidemia Fasting lipid panel with LDL of 81. Continued on a statin.  #9 constipation Patient with BM. Patient was maintained on MiraLAX twice daily as well as Senokot-S as a bowel regimen which she'll be discharged on.    Procedures:  CT head 02/01/2015  MRI head 02/01/2015  Chest x-ray 02/01/2015  2 d echo 02/02/2015  Pacemaker implant 02/04/2015 Dr Ladona Ridgel  Consultations:  Cardiology: Dr. Wyline Mood 02/01/2015  EP: Dr Ladona Ridgel 02/02/2015    Discharge Exam: Filed Vitals:   02/05/15 0402 02/05/15 0752  BP: 152/78   Pulse: 67   Temp: 98.4 F (36.9 C) 98.6 F (37 C)  Resp: 13     General: NAD Cardiovascular: RRR. left upper chest wall with hematoma and Bandage. Respiratory: CTAB  Discharge Instructions   Discharge Instructions    Diet Carb Modified    Complete by:  As  directed      Discharge instructions    Complete by:  As directed   Follow up in EP clinic as scheduled.     Increase activity slowly    Complete by:  As directed           Current Discharge Medication List    START taking these medications   Details  feeding supplement, GLUCERNA SHAKE, (GLUCERNA SHAKE) LIQD Take 237 mLs by mouth 2 (two) times daily between meals. Refills: 0    oxyCODONE (OXY IR/ROXICODONE) 5 MG  immediate release tablet Take 1 tablet (5 mg total) by mouth every 4 (four) hours as needed for moderate pain. Qty: 15 tablet, Refills: 0    polyethylene glycol (MIRALAX / GLYCOLAX) packet Take 17 g by mouth 2 (two) times daily. Qty: 14 each, Refills: 0    senna (SENOKOT) 8.6 MG TABS tablet Take 1 tablet (8.6 mg total) by mouth at bedtime. Qty: 120 each, Refills: 0      CONTINUE these medications which have NOT CHANGED   Details  insulin aspart protamine- aspart (NOVOLOG MIX 70/30) (70-30) 100 UNIT/ML injection Inject 20-30 Units into the skin 2 (two) times daily with a meal.     lisinopril (PRINIVIL,ZESTRIL) 10 MG tablet Take 1 tablet (10 mg total) by mouth daily. Qty: 90 tablet, Refills: 3    metoprolol succinate (TOPROL-XL) 25 MG 24 hr tablet Take 25 mg by mouth daily. Refills: 2    simvastatin (ZOCOR) 20 MG tablet Take 20 mg by mouth every evening.    spironolactone (ALDACTONE) 50 MG tablet Take 50 mg by mouth daily. Refills: 2    zolpidem (AMBIEN) 5 MG tablet Take 2.5-5 mg by mouth at bedtime as needed for sleep.    aspirin EC 325 MG tablet Take 1 tablet (325 mg total) by mouth daily. Qty: 30 tablet, Refills: 0      STOP taking these medications     amLODipine (NORVASC) 10 MG tablet        No Known Allergies Follow-up Information    Follow up with [facility] On 02/14/2015.   Specialty:  Cardiology   Why:  12:00 noon, wound check   Contact information:   [address] [place] [address] [phone]      Follow up with Lewayne Bunting, MD On 05/07/2015.   Specialty:  Cardiology   Why:  12:00 noon   Contact information:   [address] [place] [address] [phone]       Follow up with [facility] On 02/08/2015.   Specialty:  Cardiology   Why:  11:30AM wound check   Contact information:   [address], [address] [place] [address] [phone]       Please follow up.   Why:  f/u with MD at SNF       The results of significant diagnostics from this hospitalization (including imaging, microbiology, ancillary and laboratory) are listed below for reference.    Significant Diagnostic Studies: Dg Chest 2 View  02/05/2015  CLINICAL DATA:  Pacemaker placement EXAM: CHEST  2 VIEW COMPARISON:  02/01/2015 chest radiograph. FINDINGS: Two lead left subclavian pacemaker is noted with lead tips overlying the right atrium and right ventricle. Sternotomy wires appear aligned and intact. Stable cardiomediastinal silhouette with normal heart size. No pneumothorax. No pleural effusion. Lungs appear clear, with no acute consolidative airspace disease and no pulmonary edema. IMPRESSION: Well-positioned  2 left subclavian pacemaker. No pneumothorax. No active cardiopulmonary disease. Electronically Signed   By: Delbert Phenix M.D.   On: 02/05/2015 08:18   Ct Head Wo Contrast  02/01/2015  CLINICAL DATA:  Syncope yesterday. Weakness in both legs since yesterday. Fall. EXAM: CT HEAD WITHOUT CONTRAST TECHNIQUE: Contiguous axial images were obtained from the base of the skull through the vertex without intravenous contrast. COMPARISON:  04/10/2013 FINDINGS: Remote lacunar infarct along the posterior limb left internal capsule/ left posterior lentiform nucleus. Otherwise, the brainstem, cerebellum, cerebral peduncles, thalami, basal ganglia, basilar cisterns, and ventricular system appear within normal limits. Periventricular white matter and corona radiata hypodensities favor chronic ischemic microvascular white matter disease. No intracranial hemorrhage, mass lesion, or acute CVA. There is atherosclerotic calcification of the cavernous carotid arteries bilaterally. IMPRESSION: 1. No acute intracranial findings. 2. Periventricular white matter and corona radiata hypodensities favor chronic ischemic microvascular white matter disease. 3. Remote lacunar infarct along the  posterior limb left internal capsule/left posterior lentiform nucleus. Electronically Signed   By: Gaylyn Rong M.D.   On: 02/01/2015 16:49   Mr Brain Wo Contrast  02/01/2015  CLINICAL DATA:  One week of worsening generalized weakness, pre syncopal episode this afternoon, possibly related to heart block. History of heart block, hypertension, hyperlipidemia, diabetes. EXAM: MRI HEAD WITHOUT CONTRAST TECHNIQUE: Multiplanar, multiecho pulse sequences of the brain and surrounding structures were obtained without intravenous contrast. COMPARISON:  CT head February 01, 2015 at 1634 hours FINDINGS: The ventricles and sulci are normal for patient's age. No abnormal parenchymal signal, mass lesions, mass effect. No reduced diffusion to suggest acute ischemia. No susceptibility artifact to suggest hemorrhage. Old bilateral basal ganglia lacunar infarct with mild gliosis. Patchy supratentorial white matter T2 hyperintensities are less than expected for age. No abnormal extra-axial fluid collections. Mildly prominent posterior fossa greater than supratentorial extra-axial spaces are nonspecific. No extra-axial masses though, contrast enhanced sequences would be more sensitive. Normal major intracranial vascular flow voids seen at the skull base. Ocular globes and orbital contents are nonsuspicious though not tailored for evaluation. Status post bilateral ocular lens implants. No abnormal sellar expansion. No suspicious calvarial bone marrow signal. Craniocervical junction maintained. Visualized paranasal sinus are well aerated. Trace RIGHT greater LEFT mastoid effusion. Patient is edentulous. IMPRESSION: No acute intracranial process. Involutional changes. Mild chronic small vessel ischemic disease. Old bilateral basal ganglia lacunar infarcts. Electronically Signed   By: Awilda Metro M.D.   On: 02/01/2015 20:57   Portable Chest 1 View  02/02/2015  CLINICAL DATA:  82 year old male with weakness. EXAM: PORTABLE  CHEST 1 VIEW COMPARISON:  Radiograph dated 05/21/2009 FINDINGS: Single-view of the chest does not demonstrate a focal consolidation. There is no pleural effusion or pneumothorax. Stable cardiac silhouette. Median sternotomy wires. Degenerative changes of the shoulders. IMPRESSION: No active disease. Electronically Signed   By: Elgie Collard M.D.   On: 02/02/2015 01:50    Microbiology: Recent Results (from the past 240 hour(s))  Culture, Urine     Status: None   Collection Time: 02/01/15  7:23 PM  Result Value Ref Range Status   Specimen Description URINE, RANDOM  Final   Special Requests NONE  Final   Culture NO GROWTH 2 DAYS  Final   Report Status 02/03/2015 FINAL  Final  MRSA PCR Screening     Status: None   Collection Time: 02/01/15  9:29 PM  Result Value Ref Range Status   MRSA by PCR NEGATIVE NEGATIVE Final    Comment:  The GeneXpert MRSA Assay (FDA approved for NASAL specimens only), is one component of a comprehensive MRSA colonization surveillance program. It is not intended to diagnose MRSA infection nor to guide or monitor treatment for MRSA infections.      Labs: Basic Metabolic Panel:  Recent Labs Lab 02/01/15 1430 02/02/15 0148 02/02/15 1010 02/03/15 0245 02/04/15 0259 02/05/15 0600  NA 136  --  136 136 135 132*  K 5.2*  --  4.6 4.6 4.6 4.6  CL 101  --  104 105 105 103  CO2 24  --  GLUCOSE 178*  --  209* 165* 172* 164*  BUN 49*  --  35* 33* 34* 31*  CREATININE 1.81*  --  1.47* 1.52* 1.52* 1.30*  CALCIUM 9.6  --  8.8* 8.9 8.8* 8.8*  MG  --  2.2  --   --  2.2  --    Liver Function Tests:  Recent Labs Lab 02/02/15 0148  AST 58*  ALT 34  ALKPHOS 40  BILITOT 1.0  PROT 5.9*  ALBUMIN 3.4*   No results for input(s): LIPASE, AMYLASE in the last 168 hours. No results for input(s): AMMONIA in the last 168 hours. CBC:  Recent Labs Lab 02/01/15 1430 02/02/15 1010 02/03/15 0245  WBC 6.6 4.4 5.2  HGB 13.6 12.2* 11.6*  HCT  38.5* 35.7* 33.6*  MCV 90.6 92.5 91.8  PLT 141* 122* 120*   Cardiac Enzymes:  Recent Labs Lab 02/01/15 2204 02/02/15 0148 02/02/15 1010  TROPONINI 0.05* 0.05* 0.04*   BNP: BNP (last 3 results) No results for input(s): BNP in the last 8760 hours.  ProBNP (last 3 results) No results for input(s): PROBNP in the last 8760 hours.  CBG:  Recent Labs Lab 02/04/15 1311 02/04/15 1621 02/04/15 2123 02/05/15 0750 02/05/15 1148  GLUCAP 132* 234* 131* 146* 176*       Signed:  Fuquan Wilson MD.  [facility] 02/05/2015, 2:33 PM

## 2015-02-05 NOTE — Plan of Care (Signed)
Problem: Physical Regulation: Goal: Ability to maintain clinical measurements within normal limits will improve Outcome: Completed/Met Date Met:  02/05/15 Patient has been able to maintain his heart rate within normal limits after pacemaker insertion.  Problem: Activity: Goal: Risk for activity intolerance will decrease Outcome: Not Progressing Patient presented to ED with worsening weakness at home; requiring increased assistance with ADLs. Patient is still experiencing generalized weakness; awaiting PT evaluation.

## 2015-02-05 NOTE — Care Management Note (Signed)
Case Management Note  Patient Details  Name: Vernon Jackson MRN: [id number] Date of Birth: 08/06/1927  Subjective/Objective:  Pt admitted for symptomatic bradycardia. Pt is from home with son.                   Action/Plan: PT/ OT recommendations for SNF- CSW to speak with pt in regards to disposition needs. CM will continue to monitor as well.   Expected Discharge Date:                  Expected Discharge Plan:  Skilled Nursing Facility  In-House Referral:  Clinical Social Work  Discharge planning Services  CM Consult  Post Acute Care Choice:    Choice offered to:     DME Arranged:    DME Agency:     HH Arranged:    HH Agency:     Status of Service:  In process, will continue to follow  Medicare Important Message Given:  Yes Date Medicare IM Given:    Medicare IM give by:    Date Additional Medicare IM Given:    Additional Medicare Important Message give by:     If discussed at Long Length of Stay Meetings, dates discussed:    Additional Comments:  Gala Lewandowsky, RN 02/05/2015, 12:44 PM

## 2015-02-05 NOTE — Evaluation (Signed)
Occupational Therapy Evaluation Patient Details Name: Vernon Jackson MRN: [id number] DOB: [birth year] Today's Date: 02/05/2015    History of Present Illness 82 yo male adm 02/01/15  through [facility] ED with worsening weakness;Pacemaker 02/03/14 PMHx:  HTN,CAD, EF 60%, DM, CABGx4   Clinical Impression   Pt demonstrates significant weakness, poor sitting balance and inability to stand.  Pt requiring heavy max assist to pivot. Pt is dependent in bathing, dressing, toileting and demonstrates increased spillage and poor coordination with feeding.  Recommending SNF upon discharge. Pt is without 24 hour care at home and situation prior to this admission was not likely safe. Will follow acutely.   Follow Up Recommendations  SNF;Supervision/Assistance - 24 hour    Equipment Recommendations  3 in 1 bedside comode    Recommendations for Other Services       Precautions / Restrictions Precautions Precautions: ICD/Pacemaker Restrictions Weight Bearing Restrictions: No      Mobility Bed Mobility      General bed mobility comments: pt in chair  Transfers Overall transfer level: Needs assistance Equipment used: None Transfers: Sit to/from Stand;Stand Pivot Transfers Sit to Stand: Max assist Stand pivot transfers: Max assist       General transfer comment: assist for all aspects, posterior lean    Balance Overall balance assessment: Needs assistance Sitting-balance support: Feet supported;Single extremity supported Sitting balance-Leahy Scale: Poor   Postural control: Posterior lean Standing balance support: Bilateral upper extremity supported;During functional activity Standing balance-Leahy Scale: Zero Standing balance comment: posterior lean in standing requiring max assist to maintain midline                            ADL Overall ADL's : Needs assistance/impaired Eating/Feeding: Sitting;Moderate assistance Eating/Feeding Details (indicate cue type and reason): max  assist in bed Grooming: Wash/dry hands;Wash/dry face;Minimal assistance;Sitting   Upper Body Bathing: Sitting;Maximal assistance   Lower Body Bathing: Total assistance;Sit to/from stand   Upper Body Dressing : Moderate assistance;Sitting   Lower Body Dressing: Total assistance;Sit to/from stand   Toilet Transfer: Maximal assistance;Stand-pivot;BSC (heavy max assist)   Toileting- Clothing Manipulation and Hygiene: Total assistance;Sit to/from stand         General ADL Comments: Per sons' girlfriend, pt was having difficulty with fasteners prior to admission. Pt reports not being able to perform LB ADL in 8-10 months.     Vision Additional Comments: reports needing to get new glasses   Perception     Praxis      Pertinent Vitals/Pain Pain Assessment: No/denies pain     Hand Dominance Right   Extremity/Trunk Assessment Upper Extremity Assessment Upper Extremity Assessment: RUE deficits/detail;LUE deficits/detail RUE Deficits / Details: generally weak, 4-/5 throughout, arthritic changes in hand RUE Coordination: decreased fine motor;decreased gross motor LUE Deficits / Details: not able to assess shoulder due to pacemaker precautions, elbow to hand 4/5, arthritic changes in hand LUE: Unable to fully assess due to immobilization LUE Coordination: decreased fine motor;decreased gross motor   Lower Extremity Assessment Lower Extremity Assessment: Defer to PT evaluation RLE Deficits / Details: demo's moderate rigidity in bil LEs, able to move through [facility] with incr time, ankles onlyto neutral RLE Coordination: decreased gross motor LLE Coordination: decreased gross motor       Communication Communication Communication: No difficulties   Cognition Arousal/Alertness: Awake/alert Behavior During Therapy: WFL for tasks assessed/performed Overall Cognitive Status: Impaired/Different from baseline Area of Impairment: Problem solving;Following commands;Safety/judgement  Following Commands: Follows one step commands with increased time Safety/Judgement: Decreased awareness of safety;Decreased awareness of deficits   Problem Solving: Decreased initiation;Slow processing;Difficulty sequencing;Requires verbal cues;Requires tactile cues General Comments: Pt not a reliable historian.    General Comments       Exercises       Shoulder Instructions      Home Living Family/patient expects to be discharged to:: Unsure Living Arrangements: Children                               Additional Comments: lives with son that gets up before pt and goes to work until 5:30pm, per dtr-in-law son is "mentally challenged" and goes to bed at 7:30, he is not able to really help pt      Prior Functioning/Environment Level of Independence: Needs assistance  Gait / Transfers Assistance Needed: able to amb with RW with supervision prior to adm, very poor balance, bumping into walls, without it per dtr in law ADL's / Homemaking Assistance Needed: requiring assist of 2 for toilet transfers prior to adm per dtr-in-law, Victorino Dike (who is an Char[employer] fundraiser) Communication / Swallowing Assistance Needed: feeding pt just before adm d/t weakness Comments: has a rollator but doesn't use it    OT Diagnosis: Generalized weakness;Cognitive deficits   OT Problem List: Decreased strength;Decreased activity tolerance;Impaired balance (sitting and/or standing);Decreased cognition;Decreased safety awareness;Decreased knowledge of use of DME or AE;Decreased knowledge of precautions;Impaired UE functional use   OT Treatment/Interventions: Self-care/ADL training;DME and/or AE instruction;Patient/family education;Balance training;Cognitive remediation/compensation    OT Goals(Current goals can be found in the care plan section) Acute Rehab OT Goals Patient Stated Goal: go home OT Goal Formulation: With patient Time For Goal Achievement: 02/19/15 Potential to Achieve Goals: Fair ADL  Goals Pt Will Perform Eating: with set-up;sitting Pt Will Perform Grooming: with set-up;sitting Pt Will Perform Upper Body Bathing: with min assist;sitting Pt Will Perform Upper Body Dressing: with min assist;sitting Pt Will Transfer to Toilet: with min assist;stand pivot transfer;bedside commode Additional ADL Goal #1: Pt will demonstrate fair sitting balance in preparation for ADL at EOB.  OT Frequency: Min 2X/week   Barriers to D/C: Decreased caregiver support          Co-evaluation              End of Session Equipment Utilized During Treatment: Gait belt Nurse Communication: Mobility status  Activity Tolerance: Patient tolerated treatment well Patient left: in chair;with call bell/phone within reach;with family/visitor present   Time: 1130-1148 OT Time Calculation (min): 18 min Charges:  OT General Charges $OT Visit: 1 Procedure OT Evaluation $OT Eval Moderate Complexity: 1 Procedure G-Codes:    Evern Bio 02/05/2015, 12:23 PM  [phone]

## 2015-02-05 NOTE — Care Management Note (Addendum)
Case Management Note  Patient Details  Name: Vernon Jackson MRN: [id number] Date of Birth: 17-Jan-1928  Subjective/Objective:   Symptomatic bradycardia, Pacemaker                 Action/Plan: NCM spoke to pt and gave permission to speak to son, Caryn Bee # [phone]. Explained PT/OT recommended SNF-rehab. Pt and son, [name] in agreement to SNF. CSW referral for SNF placement. Family requesting [facility]. Notified attending with pt's decision.   Expected Discharge Date:  02/06/2015              Expected Discharge Plan:  Skilled Nursing Facility  In-House Referral:  Clinical Social Work  Discharge planning Services  CM Consult  Post Acute Care Choice:  NA Choice offered to:  NA  DME Arranged:  N/A DME Agency:  NA  HH Arranged:  NA HH Agency:  NA  Status of Service:  Completed, signed off  Medicare Important Message Given:  Yes Date Medicare IM Given:    Medicare IM give by:    Date Additional Medicare IM Given:    Additional Medicare Important Message give by:     If discussed at Long Length of Stay Meetings, dates discussed:    Additional Comments:  Elliot Cousin, RN 02/05/2015, 4:16 PM

## 2015-02-05 NOTE — Progress Notes (Signed)
SUBJECTIVE: The patient is doing well today, s/p PPM.  At this time, he denies chest pain, shortness of breath, or any new concerns. Minimal incisional soreness.  Marland Kitchen aspirin EC  325 mg Oral Daily  . feeding supplement (GLUCERNA SHAKE)  237 mL Oral BID BM  . heparin  5,000 Units Subcutaneous 3 times per day  . insulin aspart  0-9 Units Subcutaneous TID WC  . lisinopril  10 mg Oral Daily  . LORazepam  1 mg Oral Once  . metoprolol succinate  25 mg Oral Daily  . polyethylene glycol  17 g Oral BID  . senna  1 tablet Oral BID  . simvastatin  20 mg Oral QPM  . sodium chloride  3 mL Intravenous Q12H  . spironolactone  50 mg Oral Daily      OBJECTIVE: Physical Exam: Filed Vitals:   02/04/15 2249 02/05/15 0001 02/05/15 0402 02/05/15 0752  BP: 173/64 173/87 152/78   Pulse: 77 69 67   Temp:  97.9 F (36.6 C) 98.4 F (36.9 C) 98.6 F (37 C)  TempSrc:  Oral Oral Oral  Resp: Height:      Weight:   126 lb 9.6 oz (57.425 kg)   SpO2: 96% 98% 99%     Intake/Output Summary (Last 24 hours) at 02/05/15 0756 Last data filed at 02/05/15 0400  Gross per 24 hour  Intake    920 ml  Output    900 ml  Net     20 ml    Telemetry reveals sinus rhythm ventricular pacing GEN- The patient is well appearing, alert and oriented x 3 today.   Head- normocephalic, atraumatic Eyes-  Sclera clear, conjunctiva pink Ears- hearing intact Oropharynx- clear Neck- supple, no JVP Lungs- Clear to ausculation bilaterally, normal work of breathing, small-moderate sized hematoma Heart- Regular rate and rhythm, no significant murmurs, no rubs or gallops GI- soft, NT, ND Extremities- no clubbing, cyanosis, or edema Skin- no rash or lesion Psych- euthymic mood, full affect Neuro- no gross deficits appreciated PPM site is intact, dry, with moderate sized hematoma  LABS: Basic Metabolic Panel:  Recent Labs  16/10/96 0245 02/04/15 0259  NA 136 135  K 4.6 4.6  CL 105 105  CO2 22 23    GLUCOSE 165* 172*  BUN 33* 34*  CREATININE 1.52* 1.52*  CALCIUM 8.9 8.8*  MG  --  2.2   CBC:  Recent Labs  02/02/15 1010 02/03/15 0245  WBC 4.4 5.2  HGB 12.2* 11.6*  HCT 35.7* 33.6*  MCV 92.5 91.8  PLT 122* 120*   Cardiac Enzymes:  Recent Labs  02/02/15 1010  TROPONINI 0.04*    RADIOLOGY: Normal PPM function with no PTX  EKG is AV paced  DEVICE information: Medtronic (serial number [id number]) MRI compatible pacemaker, Medtronic 5076 (serial number [id number]) right atrial lead and a Medtronic 5076 (serial number [id number]) right ventricular lead   ASSESSMENT AND PLAN:   1. Bradycardia, 2:1 AVblock    s/p PPM implant 02/04/15 with Dr. Ladona Ridgel    Site is stable but will place pressure to help improve hematoma    Wound care and L arm restrictions reviewed with the patient     CXR this morning with no pneumothorax     Device was checked this morning and functioning normally  2. IM note mentions hx of PAF     Hx of TIA     Unclear when he was noted to have PAFib  Currently on ASA tx     He is a fall risk and not a candidate for systemic anti-coag at this time      3. HTN 4. DM 5. CRI    Francis Dowse, PA-C 02/05/2015 7:56 AM  EP Attending  Patient seen and examined. Agree with above. He is stable after PPM. This morning PPM interrogation demonstrates no escape when his device was turned briefly down to 30/min. His PPM function is good. He has a hematoma and will apply pressure later this morning. He can be discharged from EP perspective. We will arrange EP followup.  Leonia Reeves.D.

## 2015-02-05 NOTE — Plan of Care (Signed)
Problem: Phase III Progression Outcomes Goal: Discharge plan remains appropriate-arrangements made Outcome: Adequate for Discharge Discharge to SNF for rehab

## 2015-02-06 DIAGNOSIS — I482 Chronic atrial fibrillation: Secondary | ICD-10-CM

## 2015-02-06 LAB — GLUCOSE, CAPILLARY
GLUCOSE-CAPILLARY: 183 mg/dL — AB (ref 65–99)
GLUCOSE-CAPILLARY: 153 mg/dL — AB (ref 65–99)
GLUCOSE-CAPILLARY: 160 mg/dL — AB (ref 65–99)
Glucose-Capillary: 155 mg/dL — ABNORMAL HIGH (ref 65–99)

## 2015-02-06 LAB — BASIC METABOLIC PANEL
Anion gap: 6 (ref 5–15)
BUN: 48 mg/dL — AB (ref 6–20)
CHLORIDE: 104 mmol/L (ref 101–111)
CO2: 21 mmol/L — AB (ref 22–32)
CREATININE: 1.79 mg/dL — AB (ref 0.61–1.24)
Calcium: 8.7 mg/dL — ABNORMAL LOW (ref 8.9–10.3)
GFR calc Af Amer: 38 mL/min — ABNORMAL LOW (ref >60)
GFR calc non Af Amer: 33 mL/min — ABNORMAL LOW (ref >60)
GLUCOSE: 162 mg/dL — AB (ref 65–99)
POTASSIUM: 4.6 mmol/L (ref 3.5–5.1)
Sodium: 131 mmol/L — ABNORMAL LOW (ref 135–145)

## 2015-02-06 NOTE — Plan of Care (Signed)
Problem: Safety: Goal: Ability to remain free from injury will improve Outcome: Progressing Bed alarm is in use for patient. Patient's call bell is within reach. Staff is performing hourly rounding to assess patient's needs.  Problem: Pain Managment: Goal: General experience of comfort will improve Outcome: Progressing Patient has had no complaints of pain.  Problem: Activity: Goal: Risk for activity intolerance will decrease Outcome: Progressing Patient was able to work with physical therapy and sit up in the chair during the day on 1/17.  Problem: Bowel/Gastric: Goal: Will not experience complications related to bowel motility Outcome: Progressing Patient is receiving scheduled senna and miralax to prevent constipation.

## 2015-02-06 NOTE — Clinical Social Work Note (Signed)
Clinical Social Worker continuing to follow patient and family for support and discharge planning needs.  Patient and family chose a bed at [facility] and have completed all admission paperwork.  CSW contacted [employer] RN Loetta Rough [phone]) who states that clinical information was received too late in the day to process authorization today, but will have the authorization 01/19.  CSW updated CM and RN.  CSW remains available for support and to facilitate patient discharge needs once authorization is received.  Macario Golds, [place] [phone]

## 2015-02-06 NOTE — Discharge Summary (Signed)
Physician Discharge Summary  Vernon Jackson:[id number] DOB: [birth year] DOA: 02/01/2015  PCP: Quentin Mulling, MD  Admit date: 02/01/2015 Discharge date: 02/06/2015  Time spent: 65 minutes  Recommendations for Outpatient Follow-up:  1. Discharge to skilled nursing facility. Follow-up with M.D. at the skilled nursing facility. Patient will need a basic metabolic profile done to follow-up on electrolytes and renal function. 2. Follow-up with Dr. Ladona Ridgel at 12:00 on 05/07/2015. Patient will also need to follow-up at the wound clinic ad lib. hour heart on 02/08/2015 and 02/14/2015.   Discharge Diagnoses:  Principal Problem:   Symptomatic bradycardia: Probable Active Problems:   Coronary artery disease   CAD (coronary artery disease)   Hypertension   Hyperlipidemia   Diabetes mellitus without complication (HCC)   History of BPH   Atrial fibrillation (HCC)   TIA (transient ischemic attack)   Wenckebach second degree AV block   Chronic renal insufficiency   Weakness   Generalized weakness   Weakness generalized   Constipation   Malnutrition of moderate degree   Pacemaker   Discharge Condition: Stable and improved  Diet recommendation: carb modified.  Filed Weights   02/04/15 0500 02/05/15 0402 02/06/15 0600  Weight: 58 kg (127 lb 13.9 oz) 57.425 kg (126 lb 9.6 oz) 57.834 kg (127 lb 8 oz)    History of present illness:  Vernon Jackson is a 82 y.o. male  With history of hypertension, hyperlipidemia, coronary artery disease status post CABG 03/2009, diabetes mellitus, Wenckebach came back second-degree AV block who presented to the ED with a 5 to seven-day history of worsening generalized weakness. Most of the history was obtained from daughter-in-law and patient. 3 days prior to admission around 2 PM in the afternoon patient in conversation with his son felt he might be going into shock from his diabetes and felt very weak. He also had symptoms of presyncope at that time  however did not have any noticeable syncopal episode. A fireman who stayed next door assessed the patient and gave the patient some orange juice as well as some thing to eat patient became more alert and oriented. Blood sugars taken after patient had eaten something was 335. CBG was not drawn at the time of his symptoms. Patient's son and daughter-in-law went to see the patient at that time and patient was noticeably very weak and usually uses a walker at baseline. Patient did note that his heart rate has been in the 40s. Per family patient with increasing falls. Patient has not taken his beta blocker over the past couple of days. Patient's daughter-in-law and son moved patient into their home as they felt patient was too weak to take care of himself. The night prior to admission family was concerned patient may have had a TIA as patient was noted to have some slurred speech which lasted 1-2 minutes while on the phone and patient was noted to have some drooling and significant weakness to the point where he was a two-person assist. It was also noted that patient needed help to feed himself. No seizures noted. Patient endorses constipation. Patient denies any recent fevers, no chills, no chest pain, no shortness of breath, no nausea, no vomiting, no abdominal pain, no diarrhea, no dysuria, no cough, no melena, no hematemesis, no hematochezia, no other associated symptoms. Patient was seen in the emergency room CT head which was done was negative for any acute abnormalities. Basic metabolic profile had a potassium of 5.2 BUN of 49 creatinine of 1.81 otherwise  was within normal limits. Point-of-care troponin was negative. CBC was unremarkable. CBC was 178. Urinalysis was nitrite negative leukocytes negative. Chest x-ray was not done. [facility] hospital[facility] was called to admit the patient for further evaluation and management. Patient very hesitant to be admitted.  Hospital Course:  #1 probable symptomatic  bradycardia/Wenckeback second degree AVB Patient presented with worsening fatigue and worsening generalized weakness to the point where he's became a two-person assist at home and per family was requiring help with feeding and patient with increased falls. Patient with history of coronary artery disease status post CABG, chronic bradycardia and Wenckeback second-degree AV block. Patient appears to be in the 2-1 AV block. Heart rate  on initial admission was noted as low as 27 on the telemetry monitor. Heart rate in the high 20s to 30s while sleeping and in the 40s to 50s while awake. Patient was off his beta blocker 48 hours prior to admission and initially held during the hospitalization. Cardiac enzymes were slightly elevated troponins at 0.05 however seemed to be plateaued. 2-D echo with EF 60-65%, NWMA. Magnesium 2.2. Potassium at 4.6. TSH within normal limits at 1.015. Cardiology  was consulted and followed the patient during the hospitalization. Electrophysiology was also consulted and patient subsequently got a pacemaker placement on 02/04/2015. Post pacemaker placement hematoma was noted around site however site was without any signs of infection. Gauze was applied for pressure. Patient will be discharged to a skilled nursing facility and will need to follow-up with electrophysiology as outpatient.  #2 generalized weakness Likely secondary to problem #1. CT had MRI head negative for any acute abnormalities. TSH within normal limits. PT/OT. Patient was seen by physical therapy who recommended skilled nursing facility.  #3 diabetes mellitus Hemoglobin A1c 7.1 during this hospitalization. Patient's 70/30 was held and patient was maintained on sliding scale throughout the hospitalization. Patient will be discharged back to skilled nursing facility on his home regimen of 70/30.   #4 hypertension On admission patient's beta blocker and calcium channel blocker were held secondary to patient's  bradycardia. Patient was maintained on his ACE inhibitor for blood pressure control. After patient had pacemaker placed patient was resumed back on his home regimen of metoprolol. Outpatient follow-up.  #5 history atrial fibrillation Patient with bradycardia. Beta blocker was held during the hospitalization. Patient was maintained on aspirin. Patient was followed by electrophysiology and post pacemaker placement patient's beta blocker was resumed. Patient not a anticoagulation candidate secondary to recent falls. Continued on aspirin.  #6 chronic kidney disease stage III Stable on admission. Follow.  #7 history of TIA Continued on aspirin for secondary stroke prevention.  #8 hyperlipidemia Fasting lipid panel with LDL of 81. Continued on a statin.  #9 constipation Patient with BM. Patient was maintained on MiraLAX twice daily as well as Senokot-S as a bowel regimen which she'll be discharged on.    Procedures:  CT head 02/01/2015  MRI head 02/01/2015  Chest x-ray 02/01/2015  2 d echo 02/02/2015  Pacemaker implant 02/04/2015 Dr Ladona Ridgel  Consultations:  Cardiology: Dr. Wyline Mood 02/01/2015  EP: Dr Ladona Ridgel 02/02/2015    Discharge Exam: Filed Vitals:   02/06/15 0500 02/06/15 1500  BP: 156/76 92/49  Pulse: 69 72  Temp: 97.8 F (36.6 C) 98.3 F (36.8 C)  Resp: 16 16    General: NAD Cardiovascular: RRR. left upper chest wall with hematoma and Bandage. Respiratory: CTAB  Discharge Instructions   Discharge Instructions    Diet Carb Modified    Complete by:  As  directed      Discharge instructions    Complete by:  As directed   Follow up in EP clinic as scheduled.     Increase activity slowly    Complete by:  As directed           Current Discharge Medication List    START taking these medications   Details  feeding supplement, GLUCERNA SHAKE, (GLUCERNA SHAKE) LIQD Take 237 mLs by mouth 2 (two) times daily between meals. Refills: 0    oxyCODONE (OXY  IR/ROXICODONE) 5 MG immediate release tablet Take 1 tablet (5 mg total) by mouth every 4 (four) hours as needed for moderate pain. Qty: 15 tablet, Refills: 0    polyethylene glycol (MIRALAX / GLYCOLAX) packet Take 17 g by mouth 2 (two) times daily. Qty: 14 each, Refills: 0    senna (SENOKOT) 8.6 MG TABS tablet Take 1 tablet (8.6 mg total) by mouth at bedtime. Qty: 120 each, Refills: 0      CONTINUE these medications which have NOT CHANGED   Details  insulin aspart protamine- aspart (NOVOLOG MIX 70/30) (70-30) 100 UNIT/ML injection Inject 20-30 Units into the skin 2 (two) times daily with a meal.     lisinopril (PRINIVIL,ZESTRIL) 10 MG tablet Take 1 tablet (10 mg total) by mouth daily. Qty: 90 tablet, Refills: 3    metoprolol succinate (TOPROL-XL) 25 MG 24 hr tablet Take 25 mg by mouth daily. Refills: 2    simvastatin (ZOCOR) 20 MG tablet Take 20 mg by mouth every evening.    spironolactone (ALDACTONE) 50 MG tablet Take 50 mg by mouth daily. Refills: 2    zolpidem (AMBIEN) 5 MG tablet Take 2.5-5 mg by mouth at bedtime as needed for sleep.    aspirin EC 325 MG tablet Take 1 tablet (325 mg total) by mouth daily. Qty: 30 tablet, Refills: 0      STOP taking these medications     amLODipine (NORVASC) 10 MG tablet        No Known Allergies Follow-up Information    Follow up with [facility] On 02/14/2015.   Specialty:  Cardiology   Why:  12:00 noon, wound check   Contact information:   [address] [place] [address] [phone]      Follow up with Lewayne Bunting, MD On 05/07/2015.   Specialty:  Cardiology   Why:  12:00 noon   Contact information:   [address] [place] [address] [phone]       Follow up with [facility] On 02/08/2015.   Specialty:  Cardiology   Why:  11:30AM wound check   Contact information:   [address] [place]  [address] [phone]      Please follow up.   Why:  f/u with MD at SNF      Follow up with [facility] .   Specialty:  Skilled Nursing Facility   Contact information:   [address] [place] [address] [phone]       The results of significant diagnostics from this hospitalization (including imaging, microbiology, ancillary and laboratory) are listed below for reference.    Significant Diagnostic Studies: Dg Chest 2 View  02/05/2015  CLINICAL DATA:  Pacemaker placement EXAM: CHEST  2 VIEW COMPARISON:  02/01/2015 chest radiograph. FINDINGS: Two lead left subclavian pacemaker is noted with lead tips overlying the right atrium and right  ventricle. Sternotomy wires appear aligned and intact. Stable cardiomediastinal silhouette with normal heart size. No pneumothorax. No pleural effusion. Lungs appear clear, with no acute consolidative airspace disease and no pulmonary edema. IMPRESSION: Well-positioned 2 left subclavian pacemaker. No pneumothorax. No active cardiopulmonary disease. Electronically Signed   By: Delbert Phenix M.D.   On: 02/05/2015 08:18   Ct Head Wo Contrast  02/01/2015  CLINICAL DATA:  Syncope yesterday. Weakness in both legs since yesterday. Fall. EXAM: CT HEAD WITHOUT CONTRAST TECHNIQUE: Contiguous axial images were obtained from the base of the skull through the vertex without intravenous contrast. COMPARISON:  04/10/2013 FINDINGS: Remote lacunar infarct along the posterior limb left internal capsule/ left posterior lentiform nucleus. Otherwise, the brainstem, cerebellum, cerebral peduncles, thalami, basal ganglia, basilar cisterns, and ventricular system appear within normal limits. Periventricular white matter and corona radiata hypodensities favor chronic ischemic microvascular white matter disease. No intracranial hemorrhage, mass lesion, or acute CVA. There is atherosclerotic calcification of the cavernous carotid arteries bilaterally.  IMPRESSION: 1. No acute intracranial findings. 2. Periventricular white matter and corona radiata hypodensities favor chronic ischemic microvascular white matter disease. 3. Remote lacunar infarct along the posterior limb left internal capsule/left posterior lentiform nucleus. Electronically Signed   By: Gaylyn Rong M.D.   On: 02/01/2015 16:49   Mr Brain Wo Contrast  02/01/2015  CLINICAL DATA:  One week of worsening generalized weakness, pre syncopal episode this afternoon, possibly related to heart block. History of heart block, hypertension, hyperlipidemia, diabetes. EXAM: MRI HEAD WITHOUT CONTRAST TECHNIQUE: Multiplanar, multiecho pulse sequences of the brain and surrounding structures were obtained without intravenous contrast. COMPARISON:  CT head February 01, 2015 at 1634 hours FINDINGS: The ventricles and sulci are normal for patient's age. No abnormal parenchymal signal, mass lesions, mass effect. No reduced diffusion to suggest acute ischemia. No susceptibility artifact to suggest hemorrhage. Old bilateral basal ganglia lacunar infarct with mild gliosis. Patchy supratentorial white matter T2 hyperintensities are less than expected for age. No abnormal extra-axial fluid collections. Mildly prominent posterior fossa greater than supratentorial extra-axial spaces are nonspecific. No extra-axial masses though, contrast enhanced sequences would be more sensitive. Normal major intracranial vascular flow voids seen at the skull base. Ocular globes and orbital contents are nonsuspicious though not tailored for evaluation. Status post bilateral ocular lens implants. No abnormal sellar expansion. No suspicious calvarial bone marrow signal. Craniocervical junction maintained. Visualized paranasal sinus are well aerated. Trace RIGHT greater LEFT mastoid effusion. Patient is edentulous. IMPRESSION: No acute intracranial process. Involutional changes. Mild chronic small vessel ischemic disease. Old bilateral  basal ganglia lacunar infarcts. Electronically Signed   By: Awilda Metro M.D.   On: 02/01/2015 20:57   Portable Chest 1 View  02/02/2015  CLINICAL DATA:  82 year old male with weakness. EXAM: PORTABLE CHEST 1 VIEW COMPARISON:  Radiograph dated 05/21/2009 FINDINGS: Single-view of the chest does not demonstrate a focal consolidation. There is no pleural effusion or pneumothorax. Stable cardiac silhouette. Median sternotomy wires. Degenerative changes of the shoulders. IMPRESSION: No active disease. Electronically Signed   By: Elgie Collard M.D.   On: 02/02/2015 01:50    Microbiology: Recent Results (from the past 240 hour(s))  Culture, Urine     Status: None   Collection Time: 02/01/15  7:23 PM  Result Value Ref Range Status   Specimen Description URINE, RANDOM  Final   Special Requests NONE  Final   Culture NO GROWTH 2 DAYS  Final   Report Status 02/03/2015 FINAL  Final  MRSA PCR Screening  Status: None   Collection Time: 02/01/15  9:29 PM  Result Value Ref Range Status   MRSA by PCR NEGATIVE NEGATIVE Final    Comment:        The GeneXpert MRSA Assay (FDA approved for NASAL specimens only), is one component of a comprehensive MRSA colonization surveillance program. It is not intended to diagnose MRSA infection nor to guide or monitor treatment for MRSA infections.      Labs: Basic Metabolic Panel:  Recent Labs Lab 02/02/15 0148 02/02/15 1010 02/03/15 0245 02/04/15 0259 02/05/15 0600 02/06/15 0453  NA  --  136 136 135 132* 131*  K  --  4.6 4.6 4.6 4.6 4.6  CL  --  104 105 105 103 104  CO2  --  21*  GLUCOSE  --  209* 165* 172* 164* 162*  BUN  --  35* 33* 34* 31* 48*  CREATININE  --  1.47* 1.52* 1.52* 1.30* 1.79*  CALCIUM  --  8.8* 8.9 8.8* 8.8* 8.7*  MG 2.2  --   --  2.2  --   --    Liver Function Tests:  Recent Labs Lab 02/02/15 0148  AST 58*  ALT 34  ALKPHOS 40  BILITOT 1.0  PROT 5.9*  ALBUMIN 3.4*   No results for input(s):  LIPASE, AMYLASE in the last 168 hours. No results for input(s): AMMONIA in the last 168 hours. CBC:  Recent Labs Lab 02/01/15 1430 02/02/15 1010 02/03/15 0245  WBC 6.6 4.4 5.2  HGB 13.6 12.2* 11.6*  HCT 38.5* 35.7* 33.6*  MCV 90.6 92.5 91.8  PLT 141* 122* 120*   Cardiac Enzymes:  Recent Labs Lab 02/01/15 2204 02/02/15 0148 02/02/15 1010  TROPONINI 0.05* 0.05* 0.04*   BNP: BNP (last 3 results) No results for input(s): BNP in the last 8760 hours.  ProBNP (last 3 results) No results for input(s): PROBNP in the last 8760 hours.  CBG:  Recent Labs Lab 02/05/15 1639 02/05/15 2146 02/06/15 0730 02/06/15 1154 02/06/15 1626  GLUCAP 202* 122* 155* 183* 153*       Signed:  Natacia Chaisson MD.  [facility] 02/06/2015, 6:34 PM   02/06/2015 Pt seen and examined and no new changes. Currently awaiting for insurance authorization.   Kathlen Mody.

## 2015-02-06 NOTE — Progress Notes (Signed)
Patient for d/c today to SNF bed at [facility]- Family (son and daughter) and patient agreeable to this plan- plan transfer via EMS. Reece Levy, MSW, Theresia Majors [phone]

## 2015-02-06 NOTE — NC FL2 (Signed)
[place] MEDICAID FL2 LEVEL OF CARE SCREENING TOOL     IDENTIFICATION  Patient Name: Vernon Jackson Birthdate: July 31, 1927 Sex: male Admission Date (Current Location): 02/01/2015  [facility] and [place] Number:  Producer, television/film/video and Address:  [facility] [place], [address] [place], [place] [address]      Provider Number: [id number]  Attending Physician Name and Address:  Kathlen Mody, MD  Relative Name and Phone Number:       Current Level of Care: Hospital Recommended Level of Care: Skilled Nursing Facility Prior Approval Number:    Date Approved/Denied:   PASRR Number:   [id number]  Discharge Plan: Home    Current Diagnoses: Patient Active Problem List   Diagnosis Date Noted  . Pacemaker   . Malnutrition of moderate degree 02/03/2015  . Weakness 02/01/2015  . Generalized weakness 02/01/2015  . Symptomatic bradycardia: Probable 02/01/2015  . Weakness generalized 02/01/2015  . Constipation 02/01/2015  . Chronic renal insufficiency 08/15/2014  . Wenckebach second degree AV block   . Atrial fibrillation (HCC) 04/09/2013  . TIA (transient ischemic attack) 04/09/2013  . Stroke (HCC) 04/09/2013  . Pulsatile abdominal mass 10/20/2012  . Coronary artery disease   . Hypertension   . Hyperlipidemia   . Diabetes mellitus without complication (HCC)   . History of BPH   . CAD (coronary artery disease) 03/19/2009    Orientation RESPIRATION BLADDER Height & Weight    Self, Time, Situation, Place  Normal Continent  (165.1 cm) 126 lbs.  BEHAVIORAL SYMPTOMS/MOOD NEUROLOGICAL BOWEL NUTRITION STATUS      Continent Diet (heart healthy/carb modified)  AMBULATORY STATUS COMMUNICATION OF NEEDS Skin   Extensive Assist Verbally Normal                       Personal Care Assistance Level of Assistance  Bathing, Feeding, Dressing Bathing Assistance: Maximum assistance Feeding assistance: Limited assistance Dressing Assistance: Maximum  assistance     Functional Limitations Info             SPECIAL CARE FACTORS FREQUENCY  PT (By licensed PT), OT (By licensed OT)                    Contractures Contractures Info: Not present    Additional Factors Info  Insulin Sliding Scale       Insulin Sliding Scale Info:  (3x/day)       Current Medications (02/06/2015):  This is the current hospital active medication list Current Facility-Administered Medications  Medication Dose Route Frequency Provider Last Rate Last Dose  . acetaminophen (TYLENOL) suppository 650 mg  650 mg Rectal Q6H PRN Rodolph Bong, MD      . acetaminophen (TYLENOL) tablet 325-650 mg  325-650 mg Oral Q4H PRN Marinus Maw, MD   650 mg at 02/04/15 1747  . albuterol (PROVENTIL) (2.5 MG/3ML) 0.083% nebulizer solution 2.5 mg  2.5 mg Nebulization Q2H PRN Rodolph Bong, MD      . alum & mag hydroxide-simeth (MAALOX/MYLANTA) 200-200-20 MG/5ML suspension 30 mL  30 mL Oral Q6H PRN Rodolph Bong, MD      . aspirin EC tablet 325 mg  325 mg Oral Daily Rodolph Bong, MD   325 mg at 02/06/15 1049  . atropine injection 0.4 mg  0.4 mg Intravenous PRN Rodolph Bong, MD      . feeding supplement (GLUCERNA SHAKE) (GLUCERNA SHAKE) liquid 237 mL  237 mL  Oral BID BM Marinell Blight, RD   237 mL at 02/06/15 1049  . heparin injection 5,000 Units  5,000 Units Subcutaneous 3 times per day Rodolph Bong, MD   5,000 Units at 02/04/15 2248  . insulin aspart (novoLOG) injection 0-9 Units  0-9 Units Subcutaneous TID WC Rodolph Bong, MD   3 Units at 02/05/15 1857  . lisinopril (PRINIVIL,ZESTRIL) tablet 10 mg  10 mg Oral Daily Rodolph Bong, MD   10 mg at 02/06/15 1049  . LORazepam (ATIVAN) tablet 1 mg  1 mg Oral Once Alvira Monday, MD   1 mg at 02/01/15 2303  . metoprolol succinate (TOPROL-XL) 24 hr tablet 25 mg  25 mg Oral Daily Marinus Maw, MD   25 mg at 02/06/15 1049  . ondansetron (ZOFRAN) injection 4 mg  4 mg Intravenous Q6H PRN  Marinus Maw, MD      . ondansetron [facility]) tablet 4 mg  4 mg Oral Q6H PRN Rodolph Bong, MD      . oxyCODONE (Oxy IR/ROXICODONE) immediate release tablet 5 mg  5 mg Oral Q4H PRN Rodolph Bong, MD      . polyethylene glycol (MIRALAX / GLYCOLAX) packet 17 g  17 g Oral BID Rodolph Bong, MD   17 g at 02/06/15 1049  . senna (SENOKOT) tablet 8.6 mg  1 tablet Oral BID Rodolph Bong, MD   8.6 mg at 02/06/15 1048  . simvastatin (ZOCOR) tablet 20 mg  20 mg Oral QPM Rodolph Bong, MD   20 mg at 02/05/15 1857  . sodium chloride 0.9 % injection 3 mL  3 mL Intravenous Q12H Rodolph Bong, MD   3 mL at 02/04/15 0911  . sorbitol 70 % solution 30 mL  30 mL Oral Daily PRN Rodolph Bong, MD      . spironolactone (ALDACTONE) tablet 50 mg  50 mg Oral Daily Marinus Maw, MD   50 mg at 02/06/15 1049  . zolpidem (AMBIEN) tablet 2.5-5 mg  2.5-5 mg Oral QHS PRN Rodolph Bong, MD         Discharge Medications: Please see discharge summary for a list of discharge medications.  Relevant Imaging Results:  Relevant Lab Results:   Additional Information    Liliana Cline, LCSW

## 2015-02-07 ENCOUNTER — Telehealth: Payer: Self-pay | Admitting: Internal Medicine

## 2015-02-07 LAB — GLUCOSE, CAPILLARY
GLUCOSE-CAPILLARY: 121 mg/dL — AB (ref 65–99)
GLUCOSE-CAPILLARY: 145 mg/dL — AB (ref 65–99)
Glucose-Capillary: 218 mg/dL — ABNORMAL HIGH (ref 65–99)

## 2015-02-07 NOTE — Progress Notes (Signed)
Physical Therapy Treatment Patient Details Name: Vernon Jackson MRN: [id number] DOB: 07-20-1927 Today's Date: 02/07/2015    History of Present Illness 82 yo male adm 02/01/15  through [facility] ED with worsening weakness;Pacemaker 02/03/14 PMHx:  HTN,CAD, EF 60%, DM, CABGx4    PT Comments    Max assist for sit<>stand and stand pivot transfer to chair due to posterior lean, retropulsion, and shuffling Bil feet.  Pt will benefit from continued skilled PT services to increase functional independence and safety.  Follow Up Recommendations  SNF;Supervision/Assistance - 24 hour     Equipment Recommendations  None recommended by PT    Recommendations for Other Services       Precautions / Restrictions Precautions Precautions: ICD/Pacemaker Restrictions Weight Bearing Restrictions: No    Mobility  Bed Mobility Overal bed mobility: Needs Assistance Bed Mobility: Supine to Sit     Supine to sit: Mod assist     General bed mobility comments: Use of bed rail and cues for technique.  Assist managing Bil LEs and using bed pad to scoot to EOB.  Transfers Overall transfer level: Needs assistance Equipment used: Rolling walker (2 wheeled) Transfers: Sit to/from [employer] Sit to Stand: Max assist;+2 physical assistance Stand pivot transfers: Max assist;+2 physical assistance       General transfer comment: Parkinson like mobility.  Posterior lean, shuffling feet and management of RW.  Assist to boost and for controlled descent to chair.  Ambulation/Gait                 Stairs            Wheelchair Mobility    Modified Rankin (Stroke Patients Only)       Balance Overall balance assessment: Needs assistance Sitting-balance support: Feet supported;Bilateral upper extremity supported Sitting balance-Leahy Scale: Poor   Postural control: Posterior lean Standing balance support: Bilateral upper extremity supported;During functional activity Standing  balance-Leahy Scale: Zero                      Cognition Arousal/Alertness: Awake/alert Behavior During Therapy: WFL for tasks assessed/performed Overall Cognitive Status: Impaired/Different from baseline Area of Impairment: Problem solving;Following commands;Safety/judgement       Following Commands: Follows one step commands with increased time Safety/Judgement: Decreased awareness of safety;Decreased awareness of deficits   Problem Solving: Decreased initiation;Slow processing;Difficulty sequencing;Requires verbal cues;Requires tactile cues General Comments: Often need to repeat question before pt answers.  Very slow processing.    Exercises General Exercises - Lower Extremity Ankle Circles/Pumps: AROM;Both;10 reps;PROM;Supine Quad Sets: Strengthening;Both;10 reps;Supine Gluteal Sets: Strengthening;Both;10 reps;Supine Straight Leg Raises: Strengthening;Both;5 reps;Supine    General Comments        Pertinent Vitals/Pain Pain Assessment: No/denies pain    Home Living                      Prior Function            PT Goals (current goals can now be found in the care plan section) Acute Rehab PT Goals Patient Stated Goal: to get OOB PT Goal Formulation: With patient Time For Goal Achievement: 02/19/15 Potential to Achieve Goals: Good Progress towards PT goals: Progressing toward goals    Frequency  Min 3X/week    PT Plan Current plan remains appropriate    Co-evaluation             End of Session Equipment Utilized During Treatment: Gait belt Activity Tolerance: Patient tolerated treatment well;Patient limited  by fatigue Patient left: in chair;with call bell/phone within reach;with chair alarm set     Time: 1000-1024 PT Time Calculation (min) (ACUTE ONLY): 24 min  Charges:  $Therapeutic Exercise: 8-22 mins $Therapeutic Activity: 8-22 mins                    G CodesMichail Jewels PT, [place] [phone] Pager: [phone] 02/07/2015,  12:16 PM

## 2015-02-07 NOTE — Progress Notes (Addendum)
Attempted to call report to [facility].  Was left on hold, no one returned to answer phone call

## 2015-02-07 NOTE — Clinical Social Work Note (Signed)
Per MD, patient ready to discharge to [facility]. RN, patient/family, and facility notified of patient's discharge. RN given number for report. DC packet on chart. Ambulance transport requested. Family has completed necessary paperwork. BSW intern signing off.   Jenita Seashore BSW Intern, [phone]

## 2015-02-07 NOTE — Telephone Encounter (Signed)
Informed RN that the note's say that pt needs a wound to evaluate hematoma. I explained to her that this appt is probably still needed. RN voiced understanding.

## 2015-02-07 NOTE — Discharge Summary (Signed)
Physician Discharge Summary  Vernon Jackson NFA:[id number] DOB: [birth year] DOA: 02/01/2015  PCP: Quentin Mulling, MD  Admit date: 02/01/2015 Discharge date: 02/07/2015  Time spent: 65 minutes  Recommendations for Outpatient Follow-up:  1. Discharge to skilled nursing facility. Follow-up with M.D. at the skilled nursing facility. Patient will need a basic metabolic profile done to follow-up on electrolytes and renal function. 2. Follow-up with Dr. Ladona Ridgel at 12:00 on 05/07/2015. Patient will also need to follow-up at the wound clinic ad lib. hour heart on 02/08/2015 and 02/14/2015.   Discharge Diagnoses:  Principal Problem:   Symptomatic bradycardia: Probable Active Problems:   Coronary artery disease   CAD (coronary artery disease)   Hypertension   Hyperlipidemia   Diabetes mellitus without complication (HCC)   History of BPH   Atrial fibrillation (HCC)   TIA (transient ischemic attack)   Wenckebach second degree AV block   Chronic renal insufficiency   Weakness   Generalized weakness   Weakness generalized   Constipation   Malnutrition of moderate degree   Pacemaker   Discharge Condition: Stable and improved  Diet recommendation: carb modified.  Filed Weights   02/05/15 0402 02/06/15 0600 02/07/15 0500  Weight: 57.425 kg (126 lb 9.6 oz) 57.834 kg (127 lb 8 oz) 55.566 kg (122 lb 8 oz)    History of present illness:  Vernon Jackson is a 82 y.o. male  With history of hypertension, hyperlipidemia, coronary artery disease status post CABG 03/2009, diabetes mellitus, Wenckebach came back second-degree AV block who presented to the ED with a 5 to seven-day history of worsening generalized weakness. Most of the history was obtained from daughter-in-law and patient. 3 days prior to admission around 2 PM in the afternoon patient in conversation with his son felt he might be going into shock from his diabetes and felt very weak. He also had symptoms of presyncope at that time  however did not have any noticeable syncopal episode. A fireman who stayed next door assessed the patient and gave the patient some orange juice as well as some thing to eat patient became more alert and oriented. Blood sugars taken after patient had eaten something was 335. CBG was not drawn at the time of his symptoms. Patient's son and daughter-in-law went to see the patient at that time and patient was noticeably very weak and usually uses a walker at baseline. Patient did note that his heart rate has been in the 40s. Per family patient with increasing falls. Patient has not taken his beta blocker over the past couple of days. Patient's daughter-in-law and son moved patient into their home as they felt patient was too weak to take care of himself. The night prior to admission family was concerned patient may have had a TIA as patient was noted to have some slurred speech which lasted 1-2 minutes while on the phone and patient was noted to have some drooling and significant weakness to the point where he was a two-person assist. It was also noted that patient needed help to feed himself. No seizures noted. Patient endorses constipation. Patient denies any recent fevers, no chills, no chest pain, no shortness of breath, no nausea, no vomiting, no abdominal pain, no diarrhea, no dysuria, no cough, no melena, no hematemesis, no hematochezia, no other associated symptoms. Patient was seen in the emergency room CT head which was done was negative for any acute abnormalities. Basic metabolic profile had a potassium of 5.2 BUN of 49 creatinine of 1.81 otherwise  was within normal limits. Point-of-care troponin was negative. CBC was unremarkable. CBC was 178. Urinalysis was nitrite negative leukocytes negative. Chest x-ray was not done. [facility] hospitalist was called to admit the patient for further evaluation and management. Patient very hesitant to be admitted.  Hospital Course:  #1 probable symptomatic  bradycardia/Wenckeback second degree AVB Patient presented with worsening fatigue and worsening generalized weakness to the point where he's became a two-person assist at home and per family was requiring help with feeding and patient with increased falls. Patient with history of coronary artery disease status post CABG, chronic bradycardia and Wenckeback second-degree AV block. Patient appears to be in the 2-1 AV block. Heart rate  on initial admission was noted as low as 27 on the telemetry monitor. Heart rate in the high 20s to 30s while sleeping and in the 40s to 50s while awake. Patient was off his beta blocker 48 hours prior to admission and initially held during the hospitalization. Cardiac enzymes were slightly elevated troponins at 0.05 however seemed to be plateaued. 2-D echo with EF 60-65%, NWMA. Magnesium 2.2. Potassium at 4.6. TSH within normal limits at 1.015. Cardiology  was consulted and followed the patient during the hospitalization. Electrophysiology was also consulted and patient subsequently got a pacemaker placement on 02/04/2015. Post pacemaker placement hematoma was noted around site however site was without any signs of infection. Gauze was applied for pressure. Patient will be discharged to a skilled nursing facility and will need to follow-up with electrophysiology as outpatient.  #2 generalized weakness Likely secondary to problem #1. CT had MRI head negative for any acute abnormalities. TSH within normal limits. PT/OT. Patient was seen by physical therapy who recommended skilled nursing facility.  #3 diabetes mellitus Hemoglobin A1c 7.1 during this hospitalization. Patient's 70/30 was held and patient was maintained on sliding scale throughout the hospitalization. Patient will be discharged back to skilled nursing facility on his home regimen of 70/30.   #4 hypertension On admission patient's beta blocker and calcium channel blocker were held secondary to patient's  bradycardia. Patient was maintained on his ACE inhibitor for blood pressure control. After patient had pacemaker placed patient was resumed back on his home regimen of metoprolol. Outpatient follow-up.  #5 history atrial fibrillation Patient with bradycardia. Beta blocker was held during the hospitalization. Patient was maintained on aspirin. Patient was followed by electrophysiology and post pacemaker placement patient's beta blocker was resumed. Patient not a anticoagulation candidate secondary to recent falls. Continued on aspirin.  #6 chronic kidney disease stage III Stable on admission. Follow.  #7 history of TIA Continued on aspirin for secondary stroke prevention.  #8 hyperlipidemia Fasting lipid panel with LDL of 81. Continued on a statin.  #9 constipation Patient with BM. Patient was maintained on MiraLAX twice daily as well as Senokot-S as a bowel regimen which she'll be discharged on.   #10 patient seen and examined today and no new changes to medications.    Procedures:  CT head 02/01/2015  MRI head 02/01/2015  Chest x-ray 02/01/2015  2 d echo 02/02/2015  Pacemaker implant 02/04/2015 Dr Ladona Ridgel  Consultations:  Cardiology: Dr. Wyline Mood 02/01/2015  EP: Dr Ladona Ridgel 02/02/2015    Discharge Exam: Filed Vitals:   02/07/15 0858 02/07/15 0859  BP: 132/69   Pulse:  74  Temp:    Resp:      General: NAD Cardiovascular: RRR. left upper chest wall with hematoma and Bandage. Respiratory: CTAB  Discharge Instructions   Discharge Instructions    Diet Carb  Modified    Complete by:  As directed      Discharge instructions    Complete by:  As directed   Follow up in EP clinic as scheduled.     Increase activity slowly    Complete by:  As directed           Current Discharge Medication List    START taking these medications   Details  feeding supplement, GLUCERNA SHAKE, (GLUCERNA SHAKE) LIQD Take 237 mLs by mouth 2 (two) times daily between meals. Refills: 0     oxyCODONE (OXY IR/ROXICODONE) 5 MG immediate release tablet Take 1 tablet (5 mg total) by mouth every 4 (four) hours as needed for moderate pain. Qty: 15 tablet, Refills: 0    polyethylene glycol (MIRALAX / GLYCOLAX) packet Take 17 g by mouth 2 (two) times daily. Qty: 14 each, Refills: 0    senna (SENOKOT) 8.6 MG TABS tablet Take 1 tablet (8.6 mg total) by mouth at bedtime. Qty: 120 each, Refills: 0      CONTINUE these medications which have NOT CHANGED   Details  insulin aspart protamine- aspart (NOVOLOG MIX 70/30) (70-30) 100 UNIT/ML injection Inject 20-30 Units into the skin 2 (two) times daily with a meal.     lisinopril (PRINIVIL,ZESTRIL) 10 MG tablet Take 1 tablet (10 mg total) by mouth daily. Qty: 90 tablet, Refills: 3    metoprolol succinate (TOPROL-XL) 25 MG 24 hr tablet Take 25 mg by mouth daily. Refills: 2    simvastatin (ZOCOR) 20 MG tablet Take 20 mg by mouth every evening.    spironolactone (ALDACTONE) 50 MG tablet Take 50 mg by mouth daily. Refills: 2    zolpidem (AMBIEN) 5 MG tablet Take 2.5-5 mg by mouth at bedtime as needed for sleep.    aspirin EC 325 MG tablet Take 1 tablet (325 mg total) by mouth daily. Qty: 30 tablet, Refills: 0      STOP taking these medications     amLODipine (NORVASC) 10 MG tablet        No Known Allergies Follow-up Information    Follow up with [facility] On 02/14/2015.   Specialty:  Cardiology   Why:  12:00 noon, wound check   Contact information:   [address], [address] [place] [address] [phone]      Follow up with Lewayne Bunting, MD On 05/07/2015.   Specialty:  Cardiology   Why:  12:00 noon   Contact information:   [address] [place] [address] [phone]       Follow up with [facility] On 02/08/2015.   Specialty:  Cardiology   Why:  11:30AM wound check   Contact information:   [address], [address] [place] [address] [phone]      Please follow up.   Why:  f/u with MD at SNF      Follow up with [facility] .   Specialty:  Skilled Nursing Facility   Contact information:   [address] [place] [address] [phone]       The results of significant diagnostics from this hospitalization (including imaging, microbiology, ancillary and laboratory) are listed below for reference.    Significant Diagnostic Studies: Dg Chest 2 View  02/05/2015  CLINICAL DATA:  Pacemaker placement EXAM: CHEST  2 VIEW COMPARISON:  02/01/2015 chest radiograph. FINDINGS: Two lead left subclavian pacemaker is noted with  lead tips overlying the right atrium and right ventricle. Sternotomy wires appear aligned and intact. Stable cardiomediastinal silhouette with normal heart size. No pneumothorax. No pleural effusion. Lungs appear clear, with no acute consolidative airspace disease and no pulmonary edema. IMPRESSION: Well-positioned 2 left subclavian pacemaker. No pneumothorax. No active cardiopulmonary disease. Electronically Signed   By: Delbert Phenix M.D.   On: 02/05/2015 08:18   Ct Head Wo Contrast  02/01/2015  CLINICAL DATA:  Syncope yesterday. Weakness in both legs since yesterday. Fall. EXAM: CT HEAD WITHOUT CONTRAST TECHNIQUE: Contiguous axial images were obtained from the base of the skull through the vertex without intravenous contrast. COMPARISON:  04/10/2013 FINDINGS: Remote lacunar infarct along the posterior limb left internal capsule/ left posterior lentiform nucleus. Otherwise, the brainstem, cerebellum, cerebral peduncles, thalami, basal ganglia, basilar cisterns, and ventricular system appear within normal limits. Periventricular white matter and corona radiata hypodensities favor chronic ischemic microvascular white matter disease. No intracranial hemorrhage, mass lesion, or acute CVA. There is atherosclerotic calcification of the cavernous  carotid arteries bilaterally. IMPRESSION: 1. No acute intracranial findings. 2. Periventricular white matter and corona radiata hypodensities favor chronic ischemic microvascular white matter disease. 3. Remote lacunar infarct along the posterior limb left internal capsule/left posterior lentiform nucleus. Electronically Signed   By: Gaylyn Rong M.D.   On: 02/01/2015 16:49   Mr Brain Wo Contrast  02/01/2015  CLINICAL DATA:  One week of worsening generalized weakness, pre syncopal episode this afternoon, possibly related to heart block. History of heart block, hypertension, hyperlipidemia, diabetes. EXAM: MRI HEAD WITHOUT CONTRAST TECHNIQUE: Multiplanar, multiecho pulse sequences of the brain and surrounding structures were obtained without intravenous contrast. COMPARISON:  CT head February 01, 2015 at 1634 hours FINDINGS: The ventricles and sulci are normal for patient's age. No abnormal parenchymal signal, mass lesions, mass effect. No reduced diffusion to suggest acute ischemia. No susceptibility artifact to suggest hemorrhage. Old bilateral basal ganglia lacunar infarct with mild gliosis. Patchy supratentorial white matter T2 hyperintensities are less than expected for age. No abnormal extra-axial fluid collections. Mildly prominent posterior fossa greater than supratentorial extra-axial spaces are nonspecific. No extra-axial masses though, contrast enhanced sequences would be more sensitive. Normal major intracranial vascular flow voids seen at the skull base. Ocular globes and orbital contents are nonsuspicious though not tailored for evaluation. Status post bilateral ocular lens implants. No abnormal sellar expansion. No suspicious calvarial bone marrow signal. Craniocervical junction maintained. Visualized paranasal sinus are well aerated. Trace RIGHT greater LEFT mastoid effusion. Patient is edentulous. IMPRESSION: No acute intracranial process. Involutional changes. Mild chronic small vessel  ischemic disease. Old bilateral basal ganglia lacunar infarcts. Electronically Signed   By: Awilda Metro M.D.   On: 02/01/2015 20:57   Portable Chest 1 View  02/02/2015  CLINICAL DATA:  82 year old male with weakness. EXAM: PORTABLE CHEST 1 VIEW COMPARISON:  Radiograph dated 05/21/2009 FINDINGS: Single-view of the chest does not demonstrate a focal consolidation. There is no pleural effusion or pneumothorax. Stable cardiac silhouette. Median sternotomy wires. Degenerative changes of the shoulders. IMPRESSION: No active disease. Electronically Signed   By: Elgie Collard M.D.   On: 02/02/2015 01:50    Microbiology: Recent Results (from the past 240 hour(s))  Culture, Urine     Status: None   Collection Time: 02/01/15  7:23 PM  Result Value Ref Range Status   Specimen Description URINE, RANDOM  Final   Special Requests NONE  Final   Culture NO GROWTH 2 DAYS  Final   Report Status 02/03/2015  FINAL  Final  MRSA PCR Screening     Status: None   Collection Time: 02/01/15  9:29 PM  Result Value Ref Range Status   MRSA by PCR NEGATIVE NEGATIVE Final    Comment:        The GeneXpert MRSA Assay (FDA approved for NASAL specimens only), is one component of a comprehensive MRSA colonization surveillance program. It is not intended to diagnose MRSA infection nor to guide or monitor treatment for MRSA infections.      Labs: Basic Metabolic Panel:  Recent Labs Lab 02/02/15 0148 02/02/15 1010 02/03/15 0245 02/04/15 0259 02/05/15 0600 02/06/15 0453  NA  --  136 136 135 132* 131*  K  --  4.6 4.6 4.6 4.6 4.6  CL  --  104 105 105 103 104  CO2  --  21*  GLUCOSE  --  209* 165* 172* 164* 162*  BUN  --  35* 33* 34* 31* 48*  CREATININE  --  1.47* 1.52* 1.52* 1.30* 1.79*  CALCIUM  --  8.8* 8.9 8.8* 8.8* 8.7*  MG 2.2  --   --  2.2  --   --    Liver Function Tests:  Recent Labs Lab 02/02/15 0148  AST 58*  ALT 34  ALKPHOS 40  BILITOT 1.0  PROT 5.9*  ALBUMIN 3.4*    No results for input(s): LIPASE, AMYLASE in the last 168 hours. No results for input(s): AMMONIA in the last 168 hours. CBC:  Recent Labs Lab 02/01/15 1430 02/02/15 1010 02/03/15 0245  WBC 6.6 4.4 5.2  HGB 13.6 12.2* 11.6*  HCT 38.5* 35.7* 33.6*  MCV 90.6 92.5 91.8  PLT 141* 122* 120*   Cardiac Enzymes:  Recent Labs Lab 02/01/15 2204 02/02/15 0148 02/02/15 1010  TROPONINI 0.05* 0.05* 0.04*   BNP: BNP (last 3 results) No results for input(s): BNP in the last 8760 hours.  ProBNP (last 3 results) No results for input(s): PROBNP in the last 8760 hours.  CBG:  Recent Labs Lab 02/06/15 1154 02/06/15 1626 02/06/15 2102 02/07/15 0729 02/07/15 1111  GLUCAP 183* 153* 160* 121* 145*       Signed:  Jarae Panas MD.  [facility] 02/07/2015, 2:38 PM

## 2015-02-07 NOTE — Telephone Encounter (Signed)
New Message  RN from [facility] calling to see if wnd chk sched for 1/20 is encessary since pt is D/C today 1/19. Please call back and discuss.

## 2015-02-07 NOTE — Clinical Social Work Placement (Signed)
   CLINICAL SOCIAL WORK PLACEMENT  NOTE  Date:  02/07/2015  Patient Details  Name: FIN HUPP MRN: [id number] Date of Birth: 1927-04-04  Clinical Social Work is seeking post-discharge placement for this patient at the Skilled  Nursing Facility level of care (*CSW will initial, date and re-position this form in  chart as items are completed):  Yes   Patient/family provided with [facility] Clinical Social Work Department's list of facilities offering this level of care within the geographic area requested by the patient (or if unable, by the patient's family).  Yes   Patient/family informed of their freedom to choose among providers that offer the needed level of care, that participate in Medicare, Medicaid or managed care program needed by the patient, have an available bed and are willing to accept the patient.  Yes   Patient/family informed of [facility]'s ownership interest in [facility] and [facility], as well as of the fact that they are under no obligation to receive care at these facilities.  PASRR submitted to EDS on       PASRR number received on 02/07/15     Existing PASRR number confirmed on 02/07/15     FL2 transmitted to all facilities in geographic area requested by pt/family on 02/07/15     FL2 transmitted to all facilities within larger geographic area on       Patient informed that his/her managed care company has contracts with or will negotiate with certain facilities, including the following:        Yes   Patient/family informed of bed offers received.  Patient chooses bed at [facility]     Physician recommends and patient chooses bed at      Patient to be transferred to [facility] on 02/07/15.  Patient to be transferred to facility by [facility]     Patient family notified on 02/07/15 of transfer.  Name of family member notified:  Rhunette Croft     PHYSICIAN Please sign DNR, Please prepare prescriptions     Additional Comment:     _______________________________________________ Jenita Seashore BSW Intern, [phone]

## 2015-02-08 ENCOUNTER — Ambulatory Visit (INDEPENDENT_AMBULATORY_CARE_PROVIDER_SITE_OTHER): Payer: Medicare HMO | Admitting: *Deleted

## 2015-02-08 ENCOUNTER — Non-Acute Institutional Stay (SKILLED_NURSING_FACILITY): Payer: Medicare HMO | Admitting: Internal Medicine

## 2015-02-08 DIAGNOSIS — E440 Moderate protein-calorie malnutrition: Secondary | ICD-10-CM

## 2015-02-08 DIAGNOSIS — N183 Chronic kidney disease, stage 3 unspecified: Secondary | ICD-10-CM

## 2015-02-08 DIAGNOSIS — R1310 Dysphagia, unspecified: Secondary | ICD-10-CM | POA: Diagnosis not present

## 2015-02-08 DIAGNOSIS — I2510 Atherosclerotic heart disease of native coronary artery without angina pectoris: Secondary | ICD-10-CM

## 2015-02-08 DIAGNOSIS — I481 Persistent atrial fibrillation: Secondary | ICD-10-CM

## 2015-02-08 DIAGNOSIS — I441 Atrioventricular block, second degree: Secondary | ICD-10-CM | POA: Diagnosis not present

## 2015-02-08 DIAGNOSIS — Z950 Presence of cardiac pacemaker: Secondary | ICD-10-CM

## 2015-02-08 DIAGNOSIS — E871 Hypo-osmolality and hyponatremia: Secondary | ICD-10-CM | POA: Diagnosis not present

## 2015-02-08 DIAGNOSIS — I4819 Other persistent atrial fibrillation: Secondary | ICD-10-CM

## 2015-02-08 DIAGNOSIS — E785 Hyperlipidemia, unspecified: Secondary | ICD-10-CM | POA: Diagnosis not present

## 2015-02-08 DIAGNOSIS — R531 Weakness: Secondary | ICD-10-CM | POA: Diagnosis not present

## 2015-02-08 DIAGNOSIS — I10 Essential (primary) hypertension: Secondary | ICD-10-CM

## 2015-02-08 DIAGNOSIS — K5900 Constipation, unspecified: Secondary | ICD-10-CM | POA: Diagnosis not present

## 2015-02-08 LAB — CUP PACEART INCLINIC DEVICE CHECK
Implantable Lead Implant Date: 20170116
Implantable Lead Location: 753859
Implantable Lead Model: 5076
MDC IDC LEAD IMPLANT DT: 20170116
MDC IDC LEAD LOCATION: 753860
MDC IDC SESS DTM: 20170120133055

## 2015-02-08 NOTE — Progress Notes (Addendum)
Wound check appointment to remove pressure dressing. Steri-strips to remain in place until 10-day wound check appointment. Pressure dressing removed. Large area of yellowish ecchymosis circumferentially surrounds wound site with purplish ecchymosis extending down patient's left arm. Site soft to palpation. JA saw patient, recommended keeping pressure dressing off at this time. Patient and family aware to call if new swelling or ecchymosis develops or if patient develops signs/symptoms of infection. ROV with device clinic on 02/14/15.

## 2015-02-08 NOTE — Progress Notes (Signed)
Patient ID: Vernon Jackson, male   DOB: December 29, 1927, 82 y.o.   MRN: [id number]     Facility: [facility]   PCP: Farris Has, MD  Code Status: DNR  Allergies  Allergen Reactions  . Shrimp [Shellfish Allergy] Nausea And Vomiting    Chief Complaint  Patient presents with  . New Admit To SNF     HPI:  82 y.o. patient is here for short term rehabilitation post hospital admission from 02/01/15-02/06/15 with symptomatic bradycardia with worsening generalized weakness. He was noted to have second degree AV block. Cariology and EP team were consulted. He underwent pacemaker placement on 02/04/15. He has PMH of HTN, HLD, CAD, DM, Afib, CKD stage 3 among others. He is seen in his room today with his daughter at bedside. He was seen by cardiology today, pressure dressing was removed and now has steristrip in place of pacemaker insertion. He complaints of being weak and tired. His appetite is good.   Review of Systems:  Constitutional: Negative for fever, chills, diaphoresis.  HENT: Negative for headache, congestion, nasal discharge, difficulty swallowing.  per daughter, he has trouble swallowing big meat piece and has had incidents of choking Eyes: Negative for blurred vision, double vision and discharge.  Respiratory: Negative for cough, shortness of breath and wheezing.   Cardiovascular: Negative for chest pain, palpitations, leg swelling.  Gastrointestinal: Negative for heartburn, nausea, vomiting, abdominal pain.  Genitourinary: Negative for flank pain. has a foley catheter Musculoskeletal: Negative for back pain, falls in the facility Skin: Negative for itching, rash.  Neurological: Negative for dizziness, tingling, focal weakness Psychiatric/Behavioral: Negative for depression   Past Medical History  Diagnosis Date  . Coronary artery disease     Bypass graft surgeryx4 03/2009  . CAD (coronary artery disease) 03 01 2011    EF 60%, mild MR   . Hypertension    . Hyperlipidemia   . Diabetes mellitus without complication (HCC)   . History of BPH   . Chest pain     /1/11 echocardiogram EF 60%, mild MR,  . 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 Ladona Ridgel, MDT device, 02/04/15   Past Surgical History  Procedure Laterality Date  . Cardiac catheterization      CABG X 4 vessel complicated by Atrial fib and bradycardia  . Ep implantable device N/A 02/04/2015    Procedure: Pacemaker Implant;  Surgeon: Marinus Maw, MD;  Location: [facility] INVASIVE CV LAB;  Service: Cardiovascular;  Laterality: N/A;   Social History:   reports that he has quit smoking. He does not have any smokeless tobacco history on file. He reports that he does not drink alcohol or use illicit drugs.  Family History  Problem Relation Age of Onset  . Diabetes Father   . Diabetes Brother   . Diabetes Brother   . Diabetes Son     Medications:   Medication List       This list is accurate as of: 02/08/15  3:02 PM.  Always use your most recent med list.               aspirin EC 325 MG tablet  Take 1 tablet (325 mg total) by mouth daily.     feeding supplement (GLUCERNA SHAKE) Liqd  Take 237 mLs by mouth 2 (two) times daily between meals.     insulin aspart protamine- aspart (70-30) 100 UNIT/ML injection  Commonly known as:  NOVOLOG MIX 70/30  Inject 20-30 Units into the skin 2 (two) times daily with a meal.     lisinopril 10 MG tablet  Commonly known as:  PRINIVIL,ZESTRIL  Take 1 tablet (10 mg total) by mouth daily.     metoprolol succinate 25 MG 24 hr tablet  Commonly known as:  TOPROL-XL  Take 25 mg by mouth daily.     oxyCODONE 5 MG immediate release tablet  Commonly known as:  Oxy IR/ROXICODONE  Take 1 tablet (5 mg total) by mouth every 4 (four) hours as needed for moderate pain.     polyethylene glycol packet  Commonly known as:  MIRALAX / GLYCOLAX  Take 17 g by mouth 2 (two) times daily.     senna 8.6 MG Tabs tablet  Commonly  known as:  SENOKOT  Take 1 tablet (8.6 mg total) by mouth at bedtime.     simvastatin 20 MG tablet  Commonly known as:  ZOCOR  Take 20 mg by mouth every evening.     spironolactone 50 MG tablet  Commonly known as:  ALDACTONE  Take 50 mg by mouth daily.     zolpidem 5 MG tablet  Commonly known as:  AMBIEN  Take 2.5-5 mg by mouth at bedtime as needed for sleep.         Physical Exam: Filed Vitals:   02/08/15 1501  BP: 131/74  Pulse: 79  Temp: 97.3 F (36.3 C)  Resp: 19  SpO2: 99%    General- elderly male, well built, in no acute distress Head- normocephalic, atraumatic Nose- no maxillary or frontal sinus tenderness, no nasal discharge Throat- moist mucus membrane Eyes- no pallor, no icterus, no discharge, normal conjunctiva, normal sclera Neck- no cervical lymphadenopathy Cardiovascular- normal s1,s2, no murmurs, no leg edema Chest- bruising +, steristrip to left chest wall in place Respiratory- bilateral clear to auscultation, no wheeze, no rhonchi, no crackles, no use of accessory muscles Abdomen- bowel sounds present, soft, non tender Musculoskeletal- able to move all 4 extremities, generalized weakness, on wheelchair and needs assistance with walker tends to lean backward in sitting position Neurological- some trouble with initiation of speech, alert and oriented to person, place and time Skin- warm and dry, coccyx stage 1 pressure ulcer, raised lesion on left face and has skin breakdown with scab to left ear, no signs of infection noted Psychiatry- normal mood and affect    Labs reviewed: Basic Metabolic Panel:  Recent Labs  16/10/96 0148  02/04/15 0259 02/05/15 0600 02/06/15 0453  NA  --   < > 135 132* 131*  K  --   < > 4.6 4.6 4.6  CL  --   < > 105 103 104  CO2  --   < > 23 23 21*  GLUCOSE  --   < > 172* 164* 162*  BUN  --   < > 34* 31* 48*  CREATININE  --   < > 1.52* 1.30* 1.79*  CALCIUM  --   < > 8.8* 8.8* 8.7*  MG 2.2  --  2.2  --   --   < >  = values in this interval not displayed. Liver Function Tests:  Recent Labs  08/14/14 1644 02/02/15 0148  AST 14 58*  ALT 11 34  ALKPHOS 45 40  BILITOT 0.8 1.0  PROT 7.3 5.9*  ALBUMIN 4.4 3.4*   No results for input(s): LIPASE, AMYLASE in the last 8760 hours. No results for input(s): AMMONIA in the last 8760 hours. CBC:  Recent Labs  02/01/15 1430 02/02/15 1010 02/03/15 0245  WBC 6.6 4.4 5.2  HGB 13.6 12.2* 11.6*  HCT 38.5* 35.7* 33.6*  MCV 90.6 92.5 91.8  PLT 141* 122* 120*   Cardiac Enzymes:  Recent Labs  02/01/15 2204 02/02/15 0148 02/02/15 1010  TROPONINI 0.05* 0.05* 0.04*   BNP: Invalid input(s): POCBNP CBG:  Recent Labs  02/07/15 0729 02/07/15 1111 02/07/15 1655  GLUCAP 121* 145* 218*    Radiological Exams: Ct Head Wo Contrast  02/01/2015  CLINICAL DATA:  Syncope yesterday. Weakness in both legs since yesterday. Fall. EXAM: CT HEAD WITHOUT CONTRAST TECHNIQUE: Contiguous axial images were obtained from the base of the skull through the vertex without intravenous contrast. COMPARISON:  04/10/2013 FINDINGS: Remote lacunar infarct along the posterior limb left internal capsule/ left posterior lentiform nucleus. Otherwise, the brainstem, cerebellum, cerebral peduncles, thalami, basal ganglia, basilar cisterns, and ventricular system appear within normal limits. Periventricular white matter and corona radiata hypodensities favor chronic ischemic microvascular white matter disease. No intracranial hemorrhage, mass lesion, or acute CVA. There is atherosclerotic calcification of the cavernous carotid arteries bilaterally. IMPRESSION: 1. No acute intracranial findings. 2. Periventricular white matter and corona radiata hypodensities favor chronic ischemic microvascular white matter disease. 3. Remote lacunar infarct along the posterior limb left internal capsule/left posterior lentiform nucleus. Electronically Signed   By: Gaylyn Rong M.D.   On: 02/01/2015 16:49    Mr Brain Wo Contrast  02/01/2015  CLINICAL DATA:  One week of worsening generalized weakness, pre syncopal episode this afternoon, possibly related to heart block. History of heart block, hypertension, hyperlipidemia, diabetes. EXAM: MRI HEAD WITHOUT CONTRAST TECHNIQUE: Multiplanar, multiecho pulse sequences of the brain and surrounding structures were obtained without intravenous contrast. COMPARISON:  CT head February 01, 2015 at 1634 hours FINDINGS: The ventricles and sulci are normal for patient's age. No abnormal parenchymal signal, mass lesions, mass effect. No reduced diffusion to suggest acute ischemia. No susceptibility artifact to suggest hemorrhage. Old bilateral basal ganglia lacunar infarct with mild gliosis. Patchy supratentorial white matter T2 hyperintensities are less than expected for age. No abnormal extra-axial fluid collections. Mildly prominent posterior fossa greater than supratentorial extra-axial spaces are nonspecific. No extra-axial masses though, contrast enhanced sequences would be more sensitive. Normal major intracranial vascular flow voids seen at the skull base. Ocular globes and orbital contents are nonsuspicious though not tailored for evaluation. Status post bilateral ocular lens implants. No abnormal sellar expansion. No suspicious calvarial bone marrow signal. Craniocervical junction maintained. Visualized paranasal sinus are well aerated. Trace RIGHT greater LEFT mastoid effusion. Patient is edentulous. IMPRESSION: No acute intracranial process. Involutional changes. Mild chronic small vessel ischemic disease. Old bilateral basal ganglia lacunar infarcts. Electronically Signed   By: Awilda Metro M.D.   On: 02/01/2015 20:57   Portable Chest 1 View  02/02/2015  CLINICAL DATA:  82 year old male with weakness. EXAM: PORTABLE CHEST 1 VIEW COMPARISON:  Radiograph dated 05/21/2009 FINDINGS: Single-view of the chest does not demonstrate a focal consolidation. There is no  pleural effusion or pneumothorax. Stable cardiac silhouette. Median sternotomy wires. Degenerative changes of the shoulders. IMPRESSION: No active disease. Electronically Signed   By: Elgie Collard M.D.   On: 02/02/2015 01:50    Assessment/Plan  Generalized weakness Will have him work with physical therapy and occupational therapy team to help with gait training and muscle strengthening exercises.fall precautions. Skin care. Encourage to be out of bed. Assistance with feeding for now.   Wenckebach second degree AV block S/p pacemaker. Continue  wound care and has f/u with cardiology. On oxyIR 5 mg q4h prn pain but has not required any. Will discontinue oxyIR and have him on tylenol extra strength 500 mg 2 tab q8h prn pain.   afib Rate controlled. Continue toprol xl 25 mg daily and aspirin 325 mg daily  HTN Continue lisinopril, spironolactone and toprol xl, monitor BP, check bmp  Protein calorie malnutrition Continue protein supplement, monitor weight, encourage po intake and to provide assistance with feed for now  Dm  Currently on novolog mix 70/30 20 mg bid. Monitor cbg,  Lab Results  Component Value Date   HGBA1C 7.1* 02/04/2015    Anemia Has chronic diseases and recently had surgery both of which could be contributing to the anemia. Monitor cbc  ckd stage 3 Monitor renal function  Dysphagia With some choking episode, get SLP consult to evaluate further  Hyponatremia Monitor bmp  Constipation Continue current regimen of senokot, miralax and monitor  HLD Continue zocor  CAD Remains chest pain free, continue aspirin, toprol, lisinopril and statin  Goals of care: short term rehabilitation   Labs/tests ordered: cbc, cmp  Family/ staff Communication: reviewed care plan with patient and nursing supervisor    Oneal Grout, MD  [facility] [phone] (Monday-Friday 8 am - 5 pm) [phone] (afterhours)

## 2015-02-11 LAB — HEPATIC FUNCTION PANEL
ALT: 22 U/L (ref 10–40)
AST: 25 U/L (ref 14–40)
Alkaline Phosphatase: 56 U/L (ref 25–125)
Bilirubin, Total: 0.7 mg/dL

## 2015-02-11 LAB — CBC AND DIFFERENTIAL
HCT: 35 % — AB (ref 41–53)
Hemoglobin: 12 g/dL — AB (ref 13.5–17.5)
Platelets: 136 10*3/uL — AB (ref 150–399)
WBC: 6.4 10^3/mL

## 2015-02-11 LAB — BASIC METABOLIC PANEL
BUN: 62 mg/dL — AB (ref 4–21)
Creatinine: 1.6 mg/dL — AB (ref 0.6–1.3)
GLUCOSE: 136 mg/dL
POTASSIUM: 5.5 mmol/L — AB (ref 3.4–5.3)
SODIUM: 132 mmol/L — AB (ref 137–147)

## 2015-02-14 ENCOUNTER — Encounter: Payer: Self-pay | Admitting: Internal Medicine

## 2015-02-14 ENCOUNTER — Ambulatory Visit (INDEPENDENT_AMBULATORY_CARE_PROVIDER_SITE_OTHER): Payer: Medicare HMO | Admitting: *Deleted

## 2015-02-14 DIAGNOSIS — Z950 Presence of cardiac pacemaker: Secondary | ICD-10-CM

## 2015-02-14 DIAGNOSIS — I441 Atrioventricular block, second degree: Secondary | ICD-10-CM | POA: Diagnosis not present

## 2015-02-14 LAB — CUP PACEART INCLINIC DEVICE CHECK
Battery Voltage: 3.09 V
Brady Statistic AS VS Percent: 0 %
Date Time Interrogation Session: 20170126140833
Implantable Lead Implant Date: 20170116
Implantable Lead Location: 753859
Lead Channel Impedance Value: 589 Ohm
Lead Channel Pacing Threshold Amplitude: 0.75 V
Lead Channel Pacing Threshold Pulse Width: 0.4 ms
Lead Channel Setting Pacing Amplitude: 3.5 V
Lead Channel Setting Pacing Amplitude: 3.5 V
Lead Channel Setting Pacing Pulse Width: 0.4 ms
MDC IDC LEAD IMPLANT DT: 20170116
MDC IDC LEAD LOCATION: 753860
MDC IDC MSMT LEADCHNL RA IMPEDANCE VALUE: 399 Ohm
MDC IDC MSMT LEADCHNL RA PACING THRESHOLD AMPLITUDE: 0.75 V
MDC IDC MSMT LEADCHNL RA PACING THRESHOLD PULSEWIDTH: 0.4 ms
MDC IDC MSMT LEADCHNL RA SENSING INTR AMPL: 4.375 mV
MDC IDC MSMT LEADCHNL RV IMPEDANCE VALUE: 494 Ohm
MDC IDC MSMT LEADCHNL RV IMPEDANCE VALUE: 589 Ohm
MDC IDC SET LEADCHNL RV SENSING SENSITIVITY: 4 mV
MDC IDC STAT BRADY AP VP PERCENT: 18.45 %
MDC IDC STAT BRADY AP VS PERCENT: 0 %
MDC IDC STAT BRADY AS VP PERCENT: 81.55 %
MDC IDC STAT BRADY RA PERCENT PACED: 18.45 %
MDC IDC STAT BRADY RV PERCENT PACED: 100 %

## 2015-02-14 NOTE — Progress Notes (Signed)
Wound check appointment. Steri-strips removed. Wound without redness or edema. Incision edges approximated, wound well healed. Ecchymosis near device site now healed, healing purplish to yellow ecchymosis still present extending down patient's left arm. Normal device function. Thresholds, sensing, and impedances consistent with implant measurements. Device programmed at 3.5V/auto capture programmed on for extra safety margin until 3 month visit. Histogram distribution appropriate for patient and level of activity. No mode switches or high ventricular rates noted. Patient educated about wound care, arm mobility, lifting restrictions. ROV in 3 months with GT.

## 2015-03-06 ENCOUNTER — Non-Acute Institutional Stay: Payer: Medicare HMO | Admitting: Nurse Practitioner

## 2015-03-06 ENCOUNTER — Encounter: Payer: Self-pay | Admitting: Nurse Practitioner

## 2015-03-06 DIAGNOSIS — I481 Persistent atrial fibrillation: Secondary | ICD-10-CM | POA: Diagnosis not present

## 2015-03-06 DIAGNOSIS — I2510 Atherosclerotic heart disease of native coronary artery without angina pectoris: Secondary | ICD-10-CM

## 2015-03-06 DIAGNOSIS — I441 Atrioventricular block, second degree: Secondary | ICD-10-CM

## 2015-03-06 DIAGNOSIS — I10 Essential (primary) hypertension: Secondary | ICD-10-CM

## 2015-03-06 DIAGNOSIS — E119 Type 2 diabetes mellitus without complications: Secondary | ICD-10-CM

## 2015-03-06 DIAGNOSIS — E871 Hypo-osmolality and hyponatremia: Secondary | ICD-10-CM | POA: Diagnosis not present

## 2015-03-06 DIAGNOSIS — E875 Hyperkalemia: Secondary | ICD-10-CM

## 2015-03-06 DIAGNOSIS — I4819 Other persistent atrial fibrillation: Secondary | ICD-10-CM

## 2015-03-06 NOTE — Progress Notes (Signed)
Patient ID: Vernon Jackson, male   DOB: 30-Oct-1927, 82 y.o.   MRN: [id number]    Nursing Home Location:  [facility]   Place of Service: SNF (31)  PCP: Farris Has, MD  Allergies  Allergen Reactions  . Shrimp [Shellfish Allergy] Nausea And Vomiting    Chief Complaint  Patient presents with  . Discharge Note    Discharge from facility    HPI:  Patient is a 82 y.o. male seen today at [facility] for discharge home. He has PMH of HTN, HLD, CAD, DM, Afib, CKD stage 3. Pt at [facility] for short term rehab after hospitalization from 02/01/15-02/06/15 with symptomatic bradycardia with worsening generalized weakness. He was noted to have second degree AV block. Cariology and EP team were consulted. He underwent pacemaker placement on 02/04/15. Patient currently doing well with therapy, now stable to discharge home with home health.  Review of Systems:  Review of Systems  Constitutional: Negative for activity change, appetite change, fatigue and unexpected weight change.  HENT: Negative for congestion and hearing loss.   Eyes: Negative.   Respiratory: Negative for cough and shortness of breath.   Cardiovascular: Negative for chest pain, palpitations and leg swelling.  Gastrointestinal: Negative for abdominal pain, diarrhea and constipation.  Genitourinary: Negative for dysuria and difficulty urinating.  Musculoskeletal: Negative for myalgias and arthralgias.  Skin: Negative for color change.  Neurological: Positive for weakness. Negative for dizziness.  Psychiatric/Behavioral: Negative for behavioral problems, confusion and agitation.    Past Medical History  Diagnosis Date  . Coronary artery disease     Bypass graft surgeryx4 03/2009  . CAD (coronary artery disease) 03 01 2011    EF 60%, mild MR   . Hypertension   . Hyperlipidemia   . Diabetes mellitus without complication (HCC)   . History of BPH   . Chest pain     /1/11 echocardiogram  EF 60%, mild MR,  . 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 Ladona Ridgel, MDT device, 02/04/15   Past Surgical History  Procedure Laterality Date  . Cardiac catheterization      CABG X 4 vessel complicated by Atrial fib and bradycardia  . Ep implantable device N/A 02/04/2015    Procedure: Pacemaker Implant;  Surgeon: Marinus Maw, MD;  Location: [facility] INVASIVE CV LAB;  Service: Cardiovascular;  Laterality: N/A;   Social History:   reports that he has quit smoking. He does not have any smokeless tobacco history on file. He reports that he does not drink alcohol or use illicit drugs.  Family History  Problem Relation Age of Onset  . Diabetes Father   . Diabetes Brother   . Diabetes Brother   . Diabetes Son     Medications: Patient's Medications  New Prescriptions   No medications on file  Previous Medications   ASPIRIN EC 325 MG TABLET    Take 1 tablet (325 mg total) by mouth daily.   INSULIN ASPART PROTAMINE- ASPART (NOVOLOG MIX 70/30) (70-30) 100 UNIT/ML INJECTION    Inject 20-30 Units into the skin 2 (two) times daily with a meal.    LISINOPRIL (PRINIVIL,ZESTRIL) 10 MG TABLET    Take 1 tablet (10 mg total) by mouth daily.   METOPROLOL SUCCINATE (TOPROL-XL) 25 MG 24 HR TABLET    Take 25 mg by mouth daily.   POLYETHYLENE GLYCOL (MIRALAX / GLYCOLAX) PACKET    Take 17 g by mouth  2 (two) times daily.   SIMVASTATIN (ZOCOR) 20 MG TABLET    Take 20 mg by mouth every evening.   SPIRONOLACTONE (ALDACTONE) 50 MG TABLET    Take 50 mg by mouth daily.   ZOLPIDEM (AMBIEN) 5 MG TABLET    Take 2.5-5 mg by mouth at bedtime as needed for sleep.  Modified Medications   No medications on file  Discontinued Medications   FEEDING SUPPLEMENT, GLUCERNA SHAKE, (GLUCERNA SHAKE) LIQD    Take 237 mLs by mouth 2 (two) times daily between meals.   OXYCODONE (OXY IR/ROXICODONE) 5 MG IMMEDIATE RELEASE TABLET    Take 1 tablet (5 mg total) by mouth every 4 (four) hours as needed for moderate  pain.   SENNA (SENOKOT) 8.6 MG TABS TABLET    Take 1 tablet (8.6 mg total) by mouth at bedtime.     Physical Exam: Filed Vitals:   03/06/15 1148  BP: 131/68  Pulse: 56  Temp: 97.6 F (36.4 C)  TempSrc: Oral  Resp: 20  Height:  (1.651 m)  Weight: 121 lb (54.885 kg)    Physical Exam  Constitutional: He is oriented to person, place, and time. He appears well-developed and well-nourished. No distress.  HENT:  Head: Normocephalic and atraumatic.  Mouth/Throat: Oropharynx is clear and moist. No oropharyngeal exudate.  Eyes: Conjunctivae and EOM are normal. Pupils are equal, round, and reactive to light.  Neck: Normal range of motion. Neck supple.  Cardiovascular: Normal rate, regular rhythm and normal heart sounds.   Pulmonary/Chest: Effort normal and breath sounds normal.  Abdominal: Soft. Bowel sounds are normal.  Musculoskeletal: He exhibits no edema or tenderness.  Neurological: He is alert and oriented to person, place, and time.  Skin: Skin is warm and dry. He is not diaphoretic.  Psychiatric: He has a normal mood and affect.    Labs reviewed: Basic Metabolic Panel:  Recent Labs  16/10/96 0148  02/04/15 0259 02/05/15 0600 02/06/15 0453 02/11/15  NA  --   < > 135 132* 131* 132*  K  --   < > 4.6 4.6 4.6 5.5*  CL  --   < > 105 103 104  --   CO2  --   < > 23 23 21*  --   GLUCOSE  --   < > 172* 164* 162*  --   BUN  --   < > 34* 31* 48* 62*  CREATININE  --   < > 1.52* 1.30* 1.79* 1.6*  CALCIUM  --   < > 8.8* 8.8* 8.7*  --   MG 2.2  --  2.2  --   --   --   < > = values in this interval not displayed. Liver Function Tests:  Recent Labs  08/14/14 1644 02/02/15 0148 02/11/15  AST 14 58* 25  ALT 11 34 22  ALKPHOS 45 40 56  BILITOT 0.8 1.0  --   PROT 7.3 5.9*  --   ALBUMIN 4.4 3.4*  --    No results for input(s): LIPASE, AMYLASE in the last 8760 hours. No results for input(s): AMMONIA in the last 8760 hours. CBC:  Recent Labs  02/01/15 1430  02/02/15 1010 02/03/15 0245 02/11/15  WBC 6.6 4.4 5.2 6.4  HGB 13.6 12.2* 11.6* 12.0*  HCT 38.5* 35.7* 33.6* 35*  MCV 90.6 92.5 91.8  --   PLT 141* 122* 120* 136*   TSH:  Recent Labs  08/14/14 1644 02/01/15 2204  TSH 1.05 1.015  A1C: Lab Results  Component Value Date   HGBA1C 7.1* 02/04/2015   Lipid Panel:  Recent Labs  02/02/15 0230  CHOL 141  HDL 39*  LDLCALC 81  TRIG 098  CHOLHDL 3.6    Radiological Exams: Ct Head Wo Contrast  02/01/2015  CLINICAL DATA:  Syncope yesterday. Weakness in both legs since yesterday. Fall. EXAM: CT HEAD WITHOUT CONTRAST TECHNIQUE: Contiguous axial images were obtained from the base of the skull through the vertex without intravenous contrast. COMPARISON:  04/10/2013 FINDINGS: Remote lacunar infarct along the posterior limb left internal capsule/ left posterior lentiform nucleus. Otherwise, the brainstem, cerebellum, cerebral peduncles, thalami, basal ganglia, basilar cisterns, and ventricular system appear within normal limits. Periventricular white matter and corona radiata hypodensities favor chronic ischemic microvascular white matter disease. No intracranial hemorrhage, mass lesion, or acute CVA. There is atherosclerotic calcification of the cavernous carotid arteries bilaterally. IMPRESSION: 1. No acute intracranial findings. 2. Periventricular white matter and corona radiata hypodensities favor chronic ischemic microvascular white matter disease. 3. Remote lacunar infarct along the posterior limb left internal capsule/left posterior lentiform nucleus. Electronically Signed   By: Gaylyn Rong M.D.   On: 02/01/2015 16:49   Mr Brain Wo Contrast  02/01/2015  CLINICAL DATA:  One week of worsening generalized weakness, pre syncopal episode this afternoon, possibly related to heart block. History of heart block, hypertension, hyperlipidemia, diabetes. EXAM: MRI HEAD WITHOUT CONTRAST TECHNIQUE: Multiplanar, multiecho pulse sequences of the  brain and surrounding structures were obtained without intravenous contrast. COMPARISON:  CT head February 01, 2015 at 1634 hours FINDINGS: The ventricles and sulci are normal for patient's age. No abnormal parenchymal signal, mass lesions, mass effect. No reduced diffusion to suggest acute ischemia. No susceptibility artifact to suggest hemorrhage. Old bilateral basal ganglia lacunar infarct with mild gliosis. Patchy supratentorial white matter T2 hyperintensities are less than expected for age. No abnormal extra-axial fluid collections. Mildly prominent posterior fossa greater than supratentorial extra-axial spaces are nonspecific. No extra-axial masses though, contrast enhanced sequences would be more sensitive. Normal major intracranial vascular flow voids seen at the skull base. Ocular globes and orbital contents are nonsuspicious though not tailored for evaluation. Status post bilateral ocular lens implants. No abnormal sellar expansion. No suspicious calvarial bone marrow signal. Craniocervical junction maintained. Visualized paranasal sinus are well aerated. Trace RIGHT greater LEFT mastoid effusion. Patient is edentulous. IMPRESSION: No acute intracranial process. Involutional changes. Mild chronic small vessel ischemic disease. Old bilateral basal ganglia lacunar infarcts. Electronically Signed   By: Awilda Metro M.D.   On: 02/01/2015 20:57   Portable Chest 1 View  02/02/2015  CLINICAL DATA:  82 year old male with weakness. EXAM: PORTABLE CHEST 1 VIEW COMPARISON:  Radiograph dated 05/21/2009 FINDINGS: Single-view of the chest does not demonstrate a focal consolidation. There is no pleural effusion or pneumothorax. Stable cardiac silhouette. Median sternotomy wires. Degenerative changes of the shoulders. IMPRESSION: No active disease. Electronically Signed   By: Elgie Collard M.D.   On: 02/02/2015 01:50    Assessment/Plan 1. Wenckebach second degree AV block S/p pacemaker. Continue wound care  and has f/u with cardiology.  2. Persistent atrial fibrillation (HCC) Rate controlled, conts on toprol CL 25 mg daily with ASA  3. Essential hypertension -blood pressure stable, lisinopril, spironolactone, toprol XL  4. Coronary artery disease involving native coronary artery of native heart without angina pectoris Without chest pains, conts ASA, toprol, lisinopril and statin   5. Hyponatremia -remains stable, will get BMP today prior to discharge to recheck  6.  Hyperkalemia -potasium 5.5 on recent lab, stat BMP done today to evaluate prior to discharge.   7. Diabetes mellitus without complication (HCC) -pt unable to draw up insulin from [place], will change Rx to novolog mix flex PEN to help with administration. To cont novolog mix 70/30 20 units twice daily.   pt is stable for discharge-will need PT/OT/Nursing per home health. No DME needed. Rx written.  will need to follow up with PCP within 2 weeks.     Janene Harvey. Biagio Borg  [facility] [phone] 8 am - 5 pm) [phone] (after hours)

## 2015-03-12 ENCOUNTER — Telehealth: Payer: Self-pay | Admitting: Interventional Cardiology

## 2015-03-12 ENCOUNTER — Telehealth: Payer: Self-pay | Admitting: Internal Medicine

## 2015-03-12 NOTE — Telephone Encounter (Signed)
New Message  Please call and follow Up with the pt. The amlodipine was stopped during Hospital stay. Please evaluate this during next ov. BP has been stable.

## 2015-03-12 NOTE — Telephone Encounter (Signed)
Error. Wrong provider

## 2015-03-12 NOTE — Telephone Encounter (Signed)
Confirmed with patient he has stopped amlodipine and that he will be reevaluated at next visit.

## 2015-04-01 ENCOUNTER — Ambulatory Visit (INDEPENDENT_AMBULATORY_CARE_PROVIDER_SITE_OTHER): Payer: Commercial Managed Care - HMO | Admitting: Interventional Cardiology

## 2015-04-01 ENCOUNTER — Encounter: Payer: Self-pay | Admitting: Interventional Cardiology

## 2015-04-01 VITALS — BP 110/60 | HR 78 | Ht 65.0 in | Wt 130.0 lb

## 2015-04-01 DIAGNOSIS — E1159 Type 2 diabetes mellitus with other circulatory complications: Secondary | ICD-10-CM | POA: Diagnosis not present

## 2015-04-01 DIAGNOSIS — E785 Hyperlipidemia, unspecified: Secondary | ICD-10-CM

## 2015-04-01 DIAGNOSIS — I10 Essential (primary) hypertension: Secondary | ICD-10-CM

## 2015-04-01 DIAGNOSIS — I2583 Coronary atherosclerosis due to lipid rich plaque: Principal | ICD-10-CM

## 2015-04-01 DIAGNOSIS — I2510 Atherosclerotic heart disease of native coronary artery without angina pectoris: Secondary | ICD-10-CM | POA: Diagnosis not present

## 2015-04-01 MED ORDER — ASPIRIN 81 MG PO TBEC: 81.0000 mg | DELAYED_RELEASE_TABLET | Freq: Every day | ORAL | Status: AC

## 2015-04-01 NOTE — Addendum Note (Signed)
**Note De-Identified Jibran Crookshanks Obfuscation** Addended by: Demetrios LollVIA, Royetta Probus M on: 04/01/2015 01:04 PM   Modules accepted: Orders

## 2015-04-01 NOTE — Progress Notes (Signed)
Patient ID: Vernon Jackson, male   DOB: 09/28/1927, 82 y.o.   MRN: [id number]     Cardiology Office Note   Date:  04/01/2015   ID:  Vernon Jackson, DOB [birth year], MRN [id number]  PCP:  Farris Has, MD    No chief complaint on file. CAD   Wt Readings from Last 3 Encounters:  04/01/15 130 lb (58.968 kg)  03/06/15 121 lb (54.885 kg)  02/07/15 122 lb 8 oz (55.566 kg)       History of Present Illness: Vernon Jackson is a 82 y.o. male  who has had CABG. His walking is limited by hip pain. He has not been exercising recently except for physical therapy.   Limited by hips.    Pacer was placed for Mobitz type II block.  He has done well since that time. He states that most of the time, his heart rate is higher than what the pacemaker is set at. He does not feel much different although he has been told that he looks better. He seems to be able to do more now that he has the pacemaker.  Overall, he feels well. No symptoms of chest discomfort. No symptoms like what he had before his bypass surgery. He is accompanied by his son today. We talked about the importance of avoiding falling.  The patient's other son is also my patient. Vernon Jackson remarks that the other son has a problem eating too many candy bars.    Past Medical History  Diagnosis Date  . Coronary artery disease     Bypass graft surgeryx4 03/2009  . CAD (coronary artery disease) 03 01 2011    EF 60%, mild MR   . Hypertension   . Hyperlipidemia   . Diabetes mellitus without complication (HCC)   . History of BPH   . Chest pain     /1/11 echocardiogram EF 60%, mild MR,  . 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 Ladona Ridgel, MDT device, 02/04/15    Past Surgical History  Procedure Laterality Date  . Cardiac catheterization      CABG X 4 vessel complicated by Atrial fib and bradycardia  . Ep implantable device N/A 02/04/2015    Procedure: Pacemaker Implant;  Surgeon: Marinus Maw, MD;   Location: [facility] INVASIVE CV LAB;  Service: Cardiovascular;  Laterality: N/A;     Current Outpatient Prescriptions  Medication Sig Dispense Refill  . aspirin EC 325 MG tablet Take 1 tablet (325 mg total) by mouth daily. 30 tablet 0  . B-D ULTRAFINE III SHORT PEN 31G X 8 MM MISC See admin instructions.  0  . insulin aspart protamine- aspart (NOVOLOG MIX 70/30) (70-30) 100 UNIT/ML injection Inject 20-30 Units into the skin 2 (two) times daily with a meal.     . lisinopril (PRINIVIL,ZESTRIL) 10 MG tablet Take 1 tablet (10 mg total) by mouth daily. 90 tablet 3  . metoprolol succinate (TOPROL-XL) 25 MG 24 hr tablet Take 25 mg by mouth daily.  2  . polyethylene glycol (MIRALAX / GLYCOLAX) packet Take 17 g by mouth 2 (two) times daily. 14 each 0  . simvastatin (ZOCOR) 20 MG tablet Take 20 mg by mouth every evening.    Marland Kitchen spironolactone (ALDACTONE) 50 MG tablet Take 50 mg by mouth daily.  2  . NOVOLOG MIX 70/30 FLEXPEN (70-30) 100 UNIT/ML FlexPen Reported on 04/01/2015  0   No current facility-administered medications for this visit.  Allergies:   Shrimp    Social History:  The patient  reports that he has quit smoking. He does not have any smokeless tobacco history on file. He reports that he does not drink alcohol or use illicit drugs.   Family History:  The patient's family history includes Diabetes in his brother, brother, father, and son; Stroke in his father. There is no history of Heart attack or Hypertension.    ROS:  Please see the history of present illness.   Otherwise, review of systems are positive for weight loss and now some weight gain.  Appetite is good.   All other systems are reviewed and negative.    PHYSICAL EXAM: VS:  BP 110/60 mmHg  Pulse 78  Ht 5\' 5"  (1.651 m)  Wt 130 lb (58.968 kg)  BMI 21.63 kg/m2  SpO2 94% , BMI Body mass index is 21.63 kg/(m^2). GEN: Well nourished, well developed, in no acute distress HEENT: normal Neck: no JVD, carotid bruits, or  masses Cardiac: RRR; no murmurs, rubs, or gallops,no edema  Respiratory:  clear to auscultation bilaterally, normal work of breathing GI: soft, nontender, nondistended, + BS MS: no deformity or atrophy Skin: warm and dry, no rash Neuro:  Strength and sensation are intact Psych: euthymic mood, full affect     Recent Labs: 02/01/2015: TSH 1.015 02/04/2015: Magnesium 2.2 02/11/2015: ALT 22; BUN 62*; Creatinine 1.6*; Hemoglobin 12.0*; Platelets 136*; Potassium 5.5*; Sodium 132*   Lipid Panel    Component Value Date/Time   CHOL 141 02/02/2015 0230   TRIG 107 02/02/2015 0230   HDL 39* 02/02/2015 0230   CHOLHDL 3.6 02/02/2015 0230   VLDL 21 02/02/2015 0230   LDLCALC 81 02/02/2015 0230     Other studies Reviewed: Additional studies/ records that were reviewed today with results demonstrating: Hospital records reviewed at the time of pacemaker placement.   ASSESSMENT AND PLAN:  1. Coronary artery disease: He had CABG in 2011. No angina. Overall, he seems to be doing well. Continue aggressive secondary prevention.  Would also decrease aspirin 81 mg daily to reduce bleedinng risk. 2. Status post pacemaker for high degree AV block. Continue with routine EP follow-up. 3. Hypertension: Blood pressure well controlled today. Asked him to check his blood pressure on occasion at home. He has been on an ACE inhibitor despite to renal insufficiency.  Electrolytes have been followed and need to be followed closely especially if he is on spironolactone. 4. Hyperlipidemia: LDL controlled given his coronary artery disease as noted above in January 2017. Continue simvastatin. 5. Diabetes: Managed by primary care.   Current medicines are reviewed at length with the patient today.  The patient concerns regarding his medicines were addressed.  The following changes have been made:  Decrease aspirin  Labs/ tests ordered today include:  No orders of the defined types were placed in this encounter.     Recommend 150 minutes/week of aerobic exercise Low fat, low carb, high fiber diet recommended  Disposition:   FU in 1 year   Delorise JacksonSigned, Deliah Strehlow S., MD  04/01/2015 10:47 AM    [facility] [address], [place], [place]  [address] Phone: [phone](336) [phone]; Fax: [phone](336) [phone]

## 2015-04-01 NOTE — Patient Instructions (Addendum)
**Note De-Identified Vernon Jackson Obfuscation** Medication Instructions:  Decrease Aspirin to 81 mg daily-all of your other medications remain the same.  Labwork: None  Testing/Procedures: None  Follow-Up: Your physician wants you to follow-up in: 1 year. You will receive a reminder letter in the mail two months in advance. If you don't receive a letter, please call our office to schedule the follow-up appointment.    If you need a refill on your cardiac medications before your next appointment, please call your pharmacy.

## 2015-04-30 ENCOUNTER — Other Ambulatory Visit: Payer: Self-pay | Admitting: Nurse Practitioner

## 2015-05-07 ENCOUNTER — Encounter: Payer: Commercial Managed Care - HMO | Admitting: Internal Medicine

## 2015-05-08 ENCOUNTER — Encounter: Payer: Self-pay | Admitting: Internal Medicine

## 2015-05-22 ENCOUNTER — Encounter: Payer: Self-pay | Admitting: Internal Medicine

## 2015-05-29 ENCOUNTER — Other Ambulatory Visit: Payer: Self-pay | Admitting: Nurse Practitioner

## 2015-08-20 DEATH — deceased

## 2016-03-19 IMAGING — CR DG CHEST 2V
2 series · 2 of 2 positions shown · non-contrast
Comparison: 02/01/2015 chest radiograph.

CLINICAL DATA: Pacemaker placement

EXAM:
CHEST  2 VIEW

[chest lat]
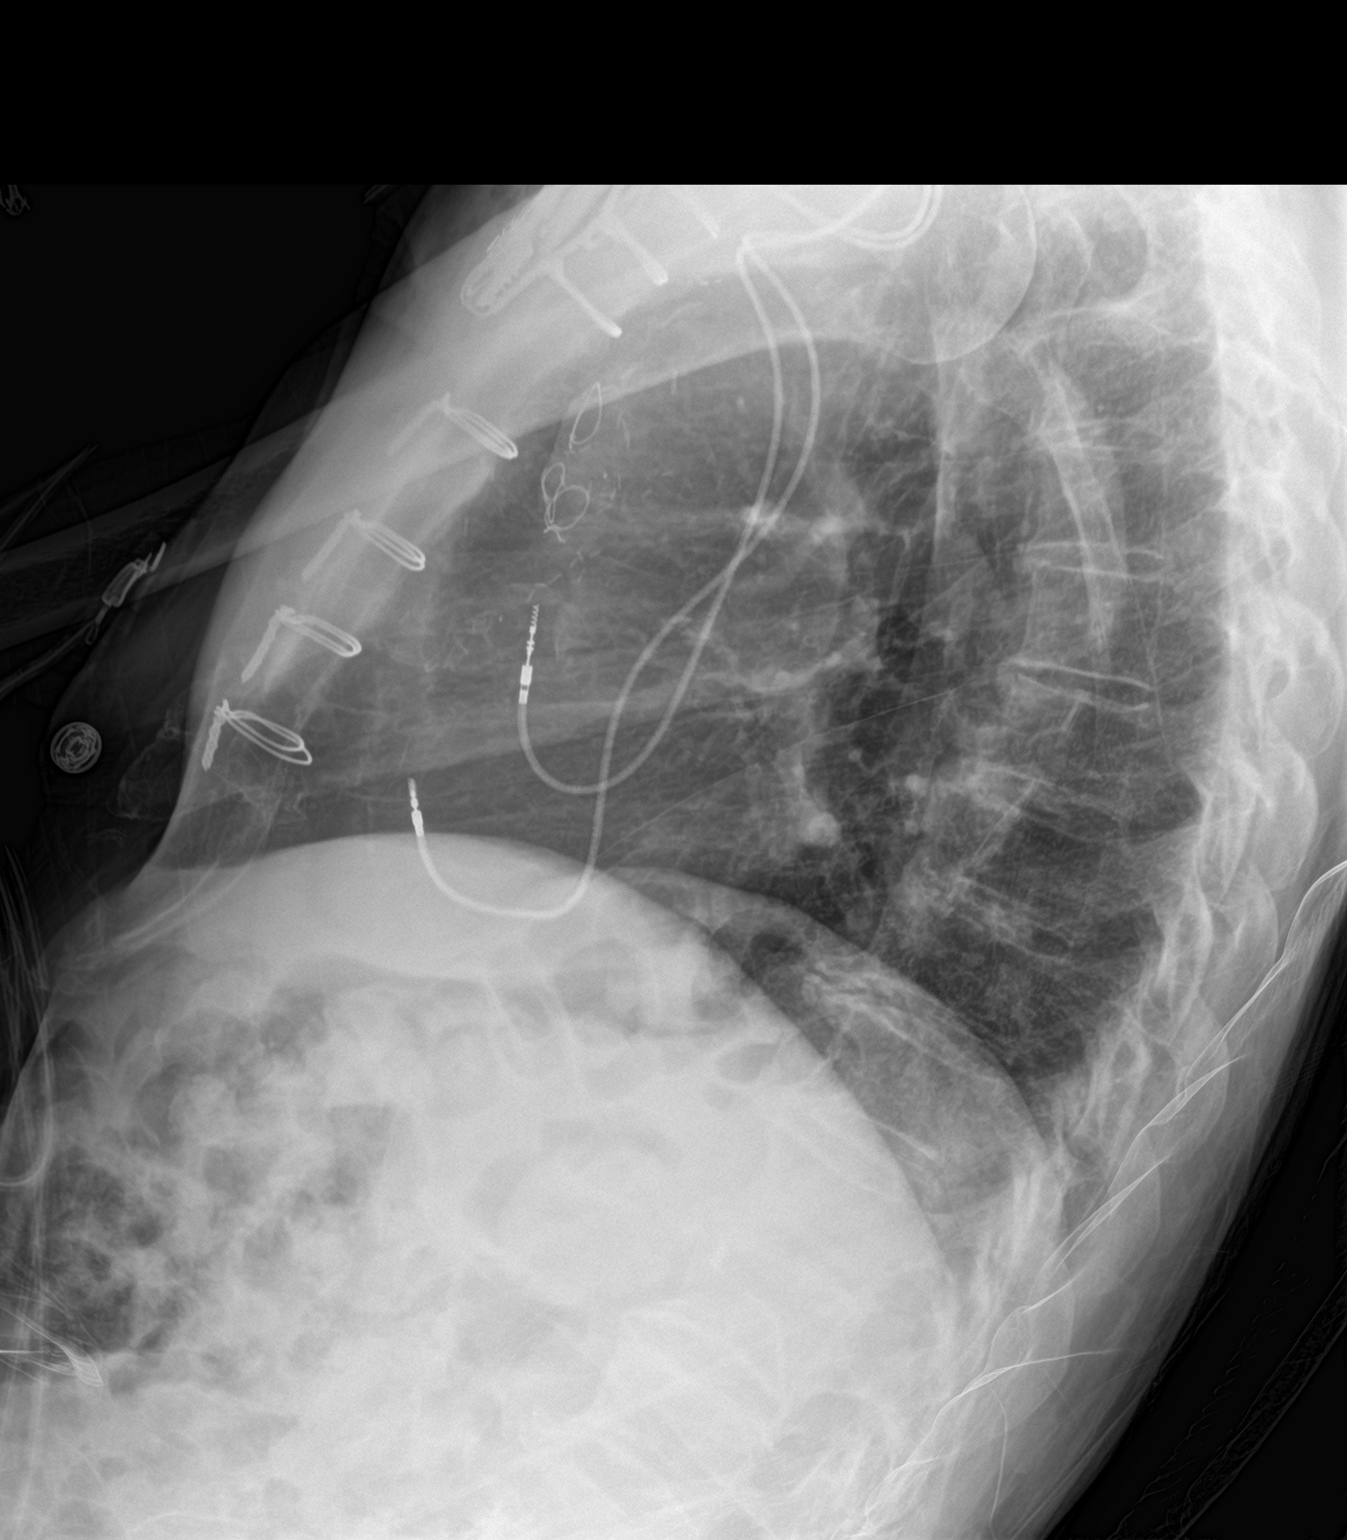

[chest ap]
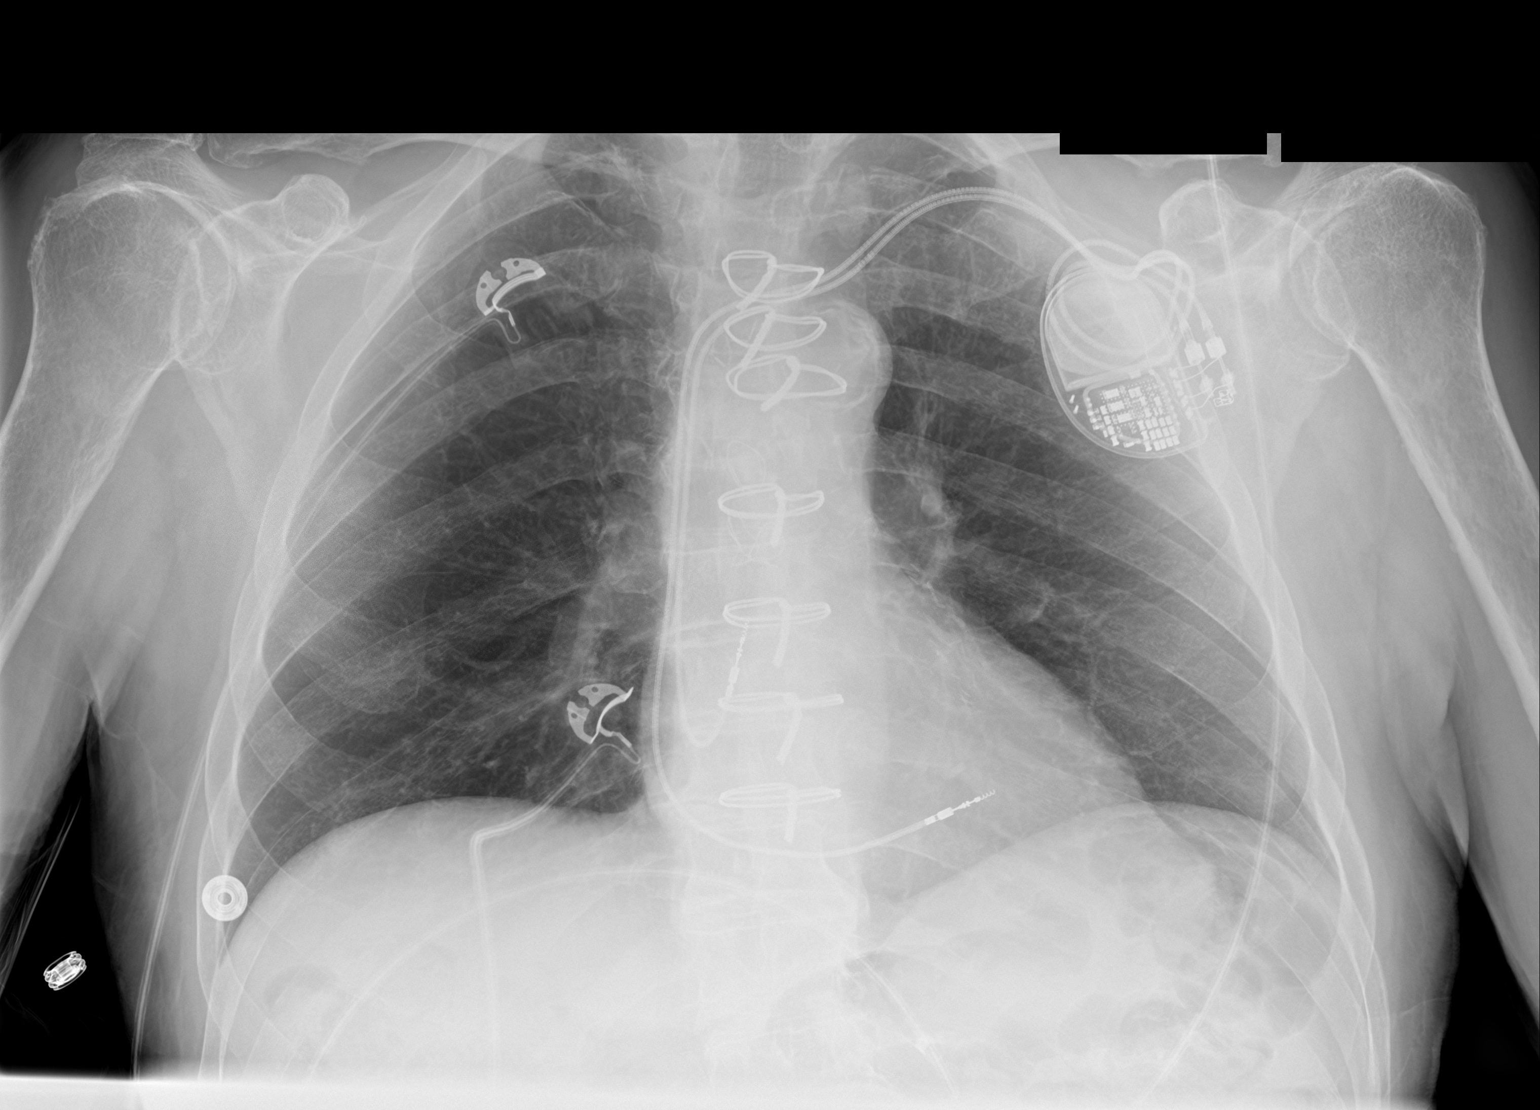

[2 of 2 positions shown; findings below may reference images not displayed]

FINDINGS: Two lead left subclavian pacemaker is noted with lead tips overlying
the right atrium and right ventricle. Sternotomy wires appear
aligned and intact. Stable cardiomediastinal silhouette with normal
heart size. No pneumothorax. No pleural effusion. Lungs appear
clear, with no acute consolidative airspace disease and no pulmonary
edema.
IMPRESSION: Well-positioned 2 left subclavian pacemaker. No pneumothorax. No
active cardiopulmonary disease.
